# Patient Record
Sex: Female | Born: 1985 | Race: White | Hispanic: No | Marital: Single | Smoking: Never smoker
Health system: Southern US, Community
[De-identification: ages and names within clinical notes are randomized; demographics above are authoritative.]

## PROBLEM LIST (undated history)

## (undated) DIAGNOSIS — Z8619 Personal history of other infectious and parasitic diseases: Secondary | ICD-10-CM

## (undated) DIAGNOSIS — F419 Anxiety disorder, unspecified: Secondary | ICD-10-CM

## (undated) DIAGNOSIS — F32A Depression, unspecified: Secondary | ICD-10-CM

## (undated) DIAGNOSIS — F329 Major depressive disorder, single episode, unspecified: Secondary | ICD-10-CM

## (undated) DIAGNOSIS — C649 Malignant neoplasm of unspecified kidney, except renal pelvis: Secondary | ICD-10-CM

## (undated) HISTORY — DX: Personal history of other infectious and parasitic diseases: Z86.19

## (undated) HISTORY — DX: Malignant neoplasm of unspecified kidney, except renal pelvis: C64.9

## (undated) DEATH — deceased

---
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
## 2003-01-29 HISTORY — PX: WISDOM TOOTH EXTRACTION: SHX21

## 2008-09-28 DIAGNOSIS — C649 Malignant neoplasm of unspecified kidney, except renal pelvis: Secondary | ICD-10-CM

## 2008-09-28 HISTORY — DX: Malignant neoplasm of unspecified kidney, except renal pelvis: C64.9

## 2008-10-27 ENCOUNTER — Ambulatory Visit (HOSPITAL_BASED_OUTPATIENT_CLINIC_OR_DEPARTMENT_OTHER): Admit: 2008-10-27 | Discharge: 2008-10-27 | Disposition: A | Discharge status: Home / Self Care | Payer: Self-pay

## 2008-11-01 ENCOUNTER — Other Ambulatory Visit
Admit: 2008-11-01 | Discharge: 2008-11-01 | Disposition: A | Discharge status: Home / Self Care | Payer: Medicaid Other | Attending: Medical Oncology | Admitting: Medical Oncology

## 2008-11-01 ENCOUNTER — Other Ambulatory Visit (HOSPITAL_BASED_OUTPATIENT_CLINIC_OR_DEPARTMENT_OTHER): Payer: Self-pay | Admitting: Medical Oncology

## 2008-11-01 DIAGNOSIS — C649 Malignant neoplasm of unspecified kidney, except renal pelvis: Secondary | ICD-10-CM

## 2008-11-03 LAB — PATHOLOGY, SURGICAL

## 2008-11-07 ENCOUNTER — Ambulatory Visit: Payer: Medicaid Other | Attending: Urology | Admitting: Urology

## 2008-11-07 DIAGNOSIS — C649 Malignant neoplasm of unspecified kidney, except renal pelvis: Secondary | ICD-10-CM | POA: Insufficient documentation

## 2008-11-09 ENCOUNTER — Ambulatory Visit (HOSPITAL_BASED_OUTPATIENT_CLINIC_OR_DEPARTMENT_OTHER): Payer: Medicaid Other

## 2008-11-09 ENCOUNTER — Ambulatory Visit: Payer: Self-pay

## 2008-11-09 ENCOUNTER — Encounter (HOSPITAL_BASED_OUTPATIENT_CLINIC_OR_DEPARTMENT_OTHER): Payer: Self-pay | Admitting: Urology

## 2008-11-09 ENCOUNTER — Ambulatory Visit: Payer: Medicaid Other | Attending: Medical Oncology | Admitting: Medical Oncology

## 2008-11-09 DIAGNOSIS — C649 Malignant neoplasm of unspecified kidney, except renal pelvis: Secondary | ICD-10-CM | POA: Insufficient documentation

## 2008-11-11 ENCOUNTER — Ambulatory Visit: Payer: Medicaid Other | Attending: Medical Oncology

## 2008-11-11 ENCOUNTER — Other Ambulatory Visit: Payer: Self-pay | Admitting: Medical Oncology

## 2008-11-11 ENCOUNTER — Ambulatory Visit (HOSPITAL_BASED_OUTPATIENT_CLINIC_OR_DEPARTMENT_OTHER): Payer: Medicaid Other

## 2008-11-11 DIAGNOSIS — C649 Malignant neoplasm of unspecified kidney, except renal pelvis: Secondary | ICD-10-CM

## 2008-11-11 LAB — PR MRI BRAIN BRAIN STEM W/O W/CONTRAST MATERIAL

## 2008-11-14 LAB — NM BONE SPECT

## 2008-11-16 ENCOUNTER — Encounter (HOSPITAL_BASED_OUTPATIENT_CLINIC_OR_DEPARTMENT_OTHER): Payer: Self-pay

## 2008-11-16 ENCOUNTER — Ambulatory Visit: Payer: Medicaid Other | Attending: Medical Oncology | Admitting: Medical Oncology

## 2008-11-16 ENCOUNTER — Ambulatory Visit (HOSPITAL_BASED_OUTPATIENT_CLINIC_OR_DEPARTMENT_OTHER): Payer: Medicaid Other

## 2008-11-16 DIAGNOSIS — Z5112 Encounter for antineoplastic immunotherapy: Secondary | ICD-10-CM | POA: Insufficient documentation

## 2008-11-16 DIAGNOSIS — C649 Malignant neoplasm of unspecified kidney, except renal pelvis: Secondary | ICD-10-CM | POA: Insufficient documentation

## 2008-11-21 ENCOUNTER — Ambulatory Visit (HOSPITAL_BASED_OUTPATIENT_CLINIC_OR_DEPARTMENT_OTHER): Payer: Self-pay

## 2008-11-28 ENCOUNTER — Ambulatory Visit: Payer: Medicaid Other | Attending: Medical Oncology

## 2008-11-28 ENCOUNTER — Encounter (HOSPITAL_BASED_OUTPATIENT_CLINIC_OR_DEPARTMENT_OTHER): Payer: Self-pay

## 2008-11-28 DIAGNOSIS — R1084 Generalized abdominal pain: Secondary | ICD-10-CM | POA: Insufficient documentation

## 2008-11-28 DIAGNOSIS — C649 Malignant neoplasm of unspecified kidney, except renal pelvis: Secondary | ICD-10-CM | POA: Insufficient documentation

## 2008-11-28 DIAGNOSIS — R5381 Other malaise: Secondary | ICD-10-CM | POA: Insufficient documentation

## 2008-11-30 ENCOUNTER — Encounter (HOSPITAL_BASED_OUTPATIENT_CLINIC_OR_DEPARTMENT_OTHER): Payer: Self-pay

## 2008-11-30 ENCOUNTER — Ambulatory Visit: Payer: Medicaid Other | Attending: Medical Oncology | Admitting: Medical Oncology

## 2008-11-30 ENCOUNTER — Ambulatory Visit (HOSPITAL_BASED_OUTPATIENT_CLINIC_OR_DEPARTMENT_OTHER): Payer: Medicaid Other

## 2008-11-30 DIAGNOSIS — C649 Malignant neoplasm of unspecified kidney, except renal pelvis: Secondary | ICD-10-CM | POA: Insufficient documentation

## 2008-11-30 DIAGNOSIS — Z5112 Encounter for antineoplastic immunotherapy: Secondary | ICD-10-CM | POA: Insufficient documentation

## 2008-12-02 ENCOUNTER — Ambulatory Visit: Payer: Medicaid Other | Attending: Medical Oncology

## 2008-12-02 DIAGNOSIS — C649 Malignant neoplasm of unspecified kidney, except renal pelvis: Secondary | ICD-10-CM | POA: Insufficient documentation

## 2008-12-05 ENCOUNTER — Ambulatory Visit: Payer: Medicaid Other | Attending: Medical Oncology

## 2008-12-05 DIAGNOSIS — Z5112 Encounter for antineoplastic immunotherapy: Secondary | ICD-10-CM | POA: Insufficient documentation

## 2008-12-05 DIAGNOSIS — C649 Malignant neoplasm of unspecified kidney, except renal pelvis: Secondary | ICD-10-CM | POA: Insufficient documentation

## 2008-12-07 ENCOUNTER — Ambulatory Visit: Payer: Medicaid Other | Attending: Medical Oncology

## 2008-12-07 DIAGNOSIS — C649 Malignant neoplasm of unspecified kidney, except renal pelvis: Secondary | ICD-10-CM | POA: Insufficient documentation

## 2008-12-07 DIAGNOSIS — Z5111 Encounter for antineoplastic chemotherapy: Secondary | ICD-10-CM | POA: Insufficient documentation

## 2008-12-07 DIAGNOSIS — R5381 Other malaise: Secondary | ICD-10-CM | POA: Insufficient documentation

## 2008-12-09 ENCOUNTER — Ambulatory Visit: Payer: Medicaid Other | Attending: Medical Oncology

## 2008-12-09 DIAGNOSIS — C649 Malignant neoplasm of unspecified kidney, except renal pelvis: Secondary | ICD-10-CM | POA: Insufficient documentation

## 2008-12-09 DIAGNOSIS — Z5111 Encounter for antineoplastic chemotherapy: Secondary | ICD-10-CM | POA: Insufficient documentation

## 2008-12-09 DIAGNOSIS — R5381 Other malaise: Secondary | ICD-10-CM | POA: Insufficient documentation

## 2008-12-12 ENCOUNTER — Ambulatory Visit: Payer: Medicaid Other | Attending: Medical Oncology

## 2008-12-12 DIAGNOSIS — Z5111 Encounter for antineoplastic chemotherapy: Secondary | ICD-10-CM | POA: Insufficient documentation

## 2008-12-12 DIAGNOSIS — C649 Malignant neoplasm of unspecified kidney, except renal pelvis: Secondary | ICD-10-CM | POA: Insufficient documentation

## 2008-12-14 ENCOUNTER — Ambulatory Visit (HOSPITAL_BASED_OUTPATIENT_CLINIC_OR_DEPARTMENT_OTHER): Payer: Medicaid Other

## 2008-12-14 ENCOUNTER — Ambulatory Visit: Payer: Medicaid Other | Attending: Medical Oncology | Admitting: Medical Oncology

## 2008-12-14 DIAGNOSIS — C439 Malignant melanoma of skin, unspecified: Secondary | ICD-10-CM | POA: Insufficient documentation

## 2008-12-14 DIAGNOSIS — C649 Malignant neoplasm of unspecified kidney, except renal pelvis: Secondary | ICD-10-CM | POA: Insufficient documentation

## 2008-12-14 DIAGNOSIS — Z5111 Encounter for antineoplastic chemotherapy: Secondary | ICD-10-CM | POA: Insufficient documentation

## 2008-12-16 ENCOUNTER — Ambulatory Visit: Payer: Medicaid Other | Attending: Medical Oncology

## 2008-12-16 DIAGNOSIS — C649 Malignant neoplasm of unspecified kidney, except renal pelvis: Secondary | ICD-10-CM | POA: Insufficient documentation

## 2008-12-16 DIAGNOSIS — Z5111 Encounter for antineoplastic chemotherapy: Secondary | ICD-10-CM | POA: Insufficient documentation

## 2008-12-19 ENCOUNTER — Ambulatory Visit: Payer: Medicaid Other | Attending: Medical Oncology

## 2008-12-19 DIAGNOSIS — C649 Malignant neoplasm of unspecified kidney, except renal pelvis: Secondary | ICD-10-CM | POA: Insufficient documentation

## 2008-12-19 DIAGNOSIS — Z5111 Encounter for antineoplastic chemotherapy: Secondary | ICD-10-CM | POA: Insufficient documentation

## 2008-12-21 ENCOUNTER — Ambulatory Visit: Payer: Medicaid Other | Attending: Medical Oncology

## 2008-12-21 DIAGNOSIS — Z5111 Encounter for antineoplastic chemotherapy: Secondary | ICD-10-CM | POA: Insufficient documentation

## 2008-12-21 DIAGNOSIS — C649 Malignant neoplasm of unspecified kidney, except renal pelvis: Secondary | ICD-10-CM | POA: Insufficient documentation

## 2008-12-23 ENCOUNTER — Ambulatory Visit: Payer: Medicaid Other | Attending: Medical Oncology

## 2008-12-23 DIAGNOSIS — C649 Malignant neoplasm of unspecified kidney, except renal pelvis: Secondary | ICD-10-CM | POA: Insufficient documentation

## 2008-12-23 DIAGNOSIS — Z5112 Encounter for antineoplastic immunotherapy: Secondary | ICD-10-CM | POA: Insufficient documentation

## 2008-12-26 ENCOUNTER — Ambulatory Visit: Payer: Medicaid Other | Attending: Medical Oncology

## 2008-12-26 DIAGNOSIS — Z5112 Encounter for antineoplastic immunotherapy: Secondary | ICD-10-CM | POA: Insufficient documentation

## 2008-12-26 DIAGNOSIS — C649 Malignant neoplasm of unspecified kidney, except renal pelvis: Secondary | ICD-10-CM | POA: Insufficient documentation

## 2008-12-28 ENCOUNTER — Ambulatory Visit (HOSPITAL_BASED_OUTPATIENT_CLINIC_OR_DEPARTMENT_OTHER): Payer: Medicaid Other

## 2008-12-28 ENCOUNTER — Encounter (HOSPITAL_BASED_OUTPATIENT_CLINIC_OR_DEPARTMENT_OTHER): Payer: Self-pay

## 2008-12-28 ENCOUNTER — Ambulatory Visit: Payer: Medicaid Other | Attending: Medical Oncology | Admitting: Medical Oncology

## 2008-12-28 DIAGNOSIS — Z5112 Encounter for antineoplastic immunotherapy: Secondary | ICD-10-CM | POA: Insufficient documentation

## 2008-12-28 DIAGNOSIS — C649 Malignant neoplasm of unspecified kidney, except renal pelvis: Secondary | ICD-10-CM | POA: Insufficient documentation

## 2008-12-30 ENCOUNTER — Ambulatory Visit: Payer: Medicaid Other | Attending: Medical Oncology

## 2008-12-30 DIAGNOSIS — Z5112 Encounter for antineoplastic immunotherapy: Secondary | ICD-10-CM | POA: Insufficient documentation

## 2008-12-30 DIAGNOSIS — C649 Malignant neoplasm of unspecified kidney, except renal pelvis: Secondary | ICD-10-CM | POA: Insufficient documentation

## 2009-01-02 ENCOUNTER — Ambulatory Visit: Payer: Medicaid Other | Attending: Medical Oncology

## 2009-01-02 DIAGNOSIS — Z5112 Encounter for antineoplastic immunotherapy: Secondary | ICD-10-CM | POA: Insufficient documentation

## 2009-01-02 DIAGNOSIS — C649 Malignant neoplasm of unspecified kidney, except renal pelvis: Secondary | ICD-10-CM | POA: Insufficient documentation

## 2009-01-04 ENCOUNTER — Ambulatory Visit: Payer: Medicaid Other | Attending: Medical Oncology

## 2009-01-04 DIAGNOSIS — Z5112 Encounter for antineoplastic immunotherapy: Secondary | ICD-10-CM | POA: Insufficient documentation

## 2009-01-04 DIAGNOSIS — C649 Malignant neoplasm of unspecified kidney, except renal pelvis: Secondary | ICD-10-CM | POA: Insufficient documentation

## 2009-01-06 ENCOUNTER — Ambulatory Visit: Payer: Medicaid Other | Attending: Medical Oncology

## 2009-01-06 DIAGNOSIS — Z5112 Encounter for antineoplastic immunotherapy: Secondary | ICD-10-CM | POA: Insufficient documentation

## 2009-01-06 DIAGNOSIS — C649 Malignant neoplasm of unspecified kidney, except renal pelvis: Secondary | ICD-10-CM | POA: Insufficient documentation

## 2009-01-09 ENCOUNTER — Encounter (HOSPITAL_BASED_OUTPATIENT_CLINIC_OR_DEPARTMENT_OTHER): Payer: Medicaid Other

## 2009-01-10 ENCOUNTER — Ambulatory Visit (HOSPITAL_BASED_OUTPATIENT_CLINIC_OR_DEPARTMENT_OTHER): Payer: Medicaid Other

## 2009-01-10 ENCOUNTER — Other Ambulatory Visit: Payer: Self-pay | Admitting: Medical Oncology

## 2009-01-10 ENCOUNTER — Ambulatory Visit: Payer: Medicaid Other | Attending: Medical Oncology

## 2009-01-10 DIAGNOSIS — C649 Malignant neoplasm of unspecified kidney, except renal pelvis: Secondary | ICD-10-CM

## 2009-01-10 DIAGNOSIS — R599 Enlarged lymph nodes, unspecified: Secondary | ICD-10-CM | POA: Insufficient documentation

## 2009-01-10 LAB — PR CT SCAN OF PELVIS CONTRAST

## 2009-01-10 LAB — PR DIAGNOSTIC COMPUTED TOMOGRAPHY THORAX W/CONTRAST

## 2009-01-10 LAB — PR CT SCAN OF ABDOMEN CONTRAST

## 2009-01-11 ENCOUNTER — Ambulatory Visit: Payer: Medicaid Other | Attending: Medical Oncology | Admitting: Medical Oncology

## 2009-01-11 ENCOUNTER — Ambulatory Visit (HOSPITAL_BASED_OUTPATIENT_CLINIC_OR_DEPARTMENT_OTHER): Payer: Medicaid Other

## 2009-01-11 DIAGNOSIS — R110 Nausea: Secondary | ICD-10-CM | POA: Insufficient documentation

## 2009-01-11 DIAGNOSIS — C649 Malignant neoplasm of unspecified kidney, except renal pelvis: Secondary | ICD-10-CM | POA: Insufficient documentation

## 2009-01-11 DIAGNOSIS — Z5112 Encounter for antineoplastic immunotherapy: Secondary | ICD-10-CM | POA: Insufficient documentation

## 2009-01-13 ENCOUNTER — Encounter (HOSPITAL_BASED_OUTPATIENT_CLINIC_OR_DEPARTMENT_OTHER): Payer: Self-pay

## 2009-01-14 ENCOUNTER — Ambulatory Visit: Payer: Medicaid Other | Attending: Medical Oncology

## 2009-01-14 DIAGNOSIS — C649 Malignant neoplasm of unspecified kidney, except renal pelvis: Secondary | ICD-10-CM | POA: Insufficient documentation

## 2009-01-25 ENCOUNTER — Ambulatory Visit (HOSPITAL_BASED_OUTPATIENT_CLINIC_OR_DEPARTMENT_OTHER): Payer: Medicaid Other

## 2009-01-25 ENCOUNTER — Ambulatory Visit: Payer: Medicaid Other | Attending: Medical Oncology

## 2009-01-25 DIAGNOSIS — Z5112 Encounter for antineoplastic immunotherapy: Secondary | ICD-10-CM | POA: Insufficient documentation

## 2009-01-25 DIAGNOSIS — C649 Malignant neoplasm of unspecified kidney, except renal pelvis: Secondary | ICD-10-CM | POA: Insufficient documentation

## 2009-01-28 HISTORY — PX: NEPHRECTOMY: SHX65

## 2009-02-08 ENCOUNTER — Ambulatory Visit (HOSPITAL_BASED_OUTPATIENT_CLINIC_OR_DEPARTMENT_OTHER): Payer: Medicaid Other

## 2009-02-08 ENCOUNTER — Ambulatory Visit: Payer: Medicaid Other | Attending: Medical Oncology | Admitting: Medical Oncology

## 2009-02-08 DIAGNOSIS — Z5112 Encounter for antineoplastic immunotherapy: Secondary | ICD-10-CM | POA: Insufficient documentation

## 2009-02-08 DIAGNOSIS — C649 Malignant neoplasm of unspecified kidney, except renal pelvis: Secondary | ICD-10-CM

## 2009-02-22 ENCOUNTER — Ambulatory Visit: Payer: Medicaid Other | Attending: Medical Oncology

## 2009-02-22 ENCOUNTER — Ambulatory Visit (HOSPITAL_BASED_OUTPATIENT_CLINIC_OR_DEPARTMENT_OTHER): Payer: Medicaid Other

## 2009-02-22 DIAGNOSIS — C649 Malignant neoplasm of unspecified kidney, except renal pelvis: Secondary | ICD-10-CM | POA: Insufficient documentation

## 2009-02-22 DIAGNOSIS — Z5112 Encounter for antineoplastic immunotherapy: Secondary | ICD-10-CM | POA: Insufficient documentation

## 2009-03-07 ENCOUNTER — Ambulatory Visit (HOSPITAL_BASED_OUTPATIENT_CLINIC_OR_DEPARTMENT_OTHER): Payer: Medicaid Other

## 2009-03-07 ENCOUNTER — Other Ambulatory Visit: Payer: Self-pay | Admitting: Medical Oncology

## 2009-03-07 ENCOUNTER — Ambulatory Visit: Payer: Medicaid Other | Attending: Medical Oncology

## 2009-03-07 DIAGNOSIS — R599 Enlarged lymph nodes, unspecified: Secondary | ICD-10-CM | POA: Insufficient documentation

## 2009-03-07 DIAGNOSIS — C649 Malignant neoplasm of unspecified kidney, except renal pelvis: Secondary | ICD-10-CM

## 2009-03-07 LAB — PR DIAGNOSTIC COMPUTED TOMOGRAPHY THORAX W/CONTRAST

## 2009-03-07 LAB — PR CT ABDOMEN AND PELVIS W CONT

## 2009-03-08 ENCOUNTER — Ambulatory Visit: Payer: Medicaid Other | Attending: Medical Oncology | Admitting: Medical Oncology

## 2009-03-08 ENCOUNTER — Encounter (HOSPITAL_BASED_OUTPATIENT_CLINIC_OR_DEPARTMENT_OTHER): Payer: Medicaid Other

## 2009-03-08 DIAGNOSIS — C649 Malignant neoplasm of unspecified kidney, except renal pelvis: Secondary | ICD-10-CM | POA: Insufficient documentation

## 2009-03-13 ENCOUNTER — Ambulatory Visit: Payer: Medicaid Other | Attending: Urology | Admitting: Urology

## 2009-03-13 DIAGNOSIS — C649 Malignant neoplasm of unspecified kidney, except renal pelvis: Secondary | ICD-10-CM | POA: Insufficient documentation

## 2009-03-24 ENCOUNTER — Ambulatory Visit: Payer: Medicaid Other

## 2009-03-29 ENCOUNTER — Inpatient Hospital Stay
Admission: RE | Admit: 2009-03-29 | Discharge: 2009-04-04 | DRG: 567 | Disposition: A | Discharge status: Home / Self Care | Payer: Medicaid Other | Attending: Urology | Admitting: Urology

## 2009-03-29 ENCOUNTER — Other Ambulatory Visit (HOSPITAL_BASED_OUTPATIENT_CLINIC_OR_DEPARTMENT_OTHER): Payer: Self-pay | Admitting: Urology

## 2009-03-29 ENCOUNTER — Inpatient Hospital Stay (HOSPITAL_COMMUNITY): Payer: Medicaid Other | Admitting: Urology

## 2009-03-29 DIAGNOSIS — C649 Malignant neoplasm of unspecified kidney, except renal pelvis: Principal | ICD-10-CM | POA: Diagnosis present

## 2009-03-29 DIAGNOSIS — N289 Disorder of kidney and ureter, unspecified: Secondary | ICD-10-CM

## 2009-03-29 DIAGNOSIS — Z9221 Personal history of antineoplastic chemotherapy: Secondary | ICD-10-CM

## 2009-03-29 DIAGNOSIS — C772 Secondary and unspecified malignant neoplasm of intra-abdominal lymph nodes: Secondary | ICD-10-CM

## 2009-03-29 DIAGNOSIS — R000 Tachycardia, unspecified: Secondary | ICD-10-CM | POA: Diagnosis not present

## 2009-03-29 DIAGNOSIS — R599 Enlarged lymph nodes, unspecified: Secondary | ICD-10-CM | POA: Diagnosis present

## 2009-04-02 DIAGNOSIS — J9819 Other pulmonary collapse: Secondary | ICD-10-CM

## 2009-04-03 DIAGNOSIS — R109 Unspecified abdominal pain: Secondary | ICD-10-CM

## 2009-04-03 DIAGNOSIS — G8918 Other acute postprocedural pain: Secondary | ICD-10-CM

## 2009-04-03 DIAGNOSIS — R079 Chest pain, unspecified: Secondary | ICD-10-CM

## 2009-04-04 DIAGNOSIS — R071 Chest pain on breathing: Secondary | ICD-10-CM

## 2009-04-12 ENCOUNTER — Other Ambulatory Visit: Payer: Self-pay | Admitting: Urology

## 2009-04-12 ENCOUNTER — Ambulatory Visit: Payer: Medicaid Other | Attending: Urology

## 2009-04-12 DIAGNOSIS — G8918 Other acute postprocedural pain: Secondary | ICD-10-CM

## 2009-04-12 DIAGNOSIS — C649 Malignant neoplasm of unspecified kidney, except renal pelvis: Secondary | ICD-10-CM | POA: Insufficient documentation

## 2009-04-13 LAB — X-RAY CHEST 2 VW

## 2009-05-03 ENCOUNTER — Ambulatory Visit: Payer: Medicaid Other | Attending: Medical Oncology

## 2009-05-03 DIAGNOSIS — C649 Malignant neoplasm of unspecified kidney, except renal pelvis: Secondary | ICD-10-CM | POA: Insufficient documentation

## 2009-05-15 ENCOUNTER — Ambulatory Visit: Payer: Medicaid Other | Attending: Urology | Admitting: Urology

## 2009-05-15 DIAGNOSIS — C649 Malignant neoplasm of unspecified kidney, except renal pelvis: Secondary | ICD-10-CM | POA: Insufficient documentation

## 2009-05-30 ENCOUNTER — Ambulatory Visit (HOSPITAL_BASED_OUTPATIENT_CLINIC_OR_DEPARTMENT_OTHER): Payer: MEDICAID

## 2009-05-30 ENCOUNTER — Other Ambulatory Visit: Payer: Self-pay | Admitting: Medical Oncology

## 2009-05-30 ENCOUNTER — Ambulatory Visit: Payer: MEDICAID | Attending: Medical Oncology

## 2009-05-30 DIAGNOSIS — C649 Malignant neoplasm of unspecified kidney, except renal pelvis: Secondary | ICD-10-CM | POA: Insufficient documentation

## 2009-05-31 ENCOUNTER — Ambulatory Visit (HOSPITAL_BASED_OUTPATIENT_CLINIC_OR_DEPARTMENT_OTHER): Payer: MEDICAID

## 2009-05-31 ENCOUNTER — Ambulatory Visit: Payer: MEDICAID | Attending: Medical Oncology | Admitting: Medical Oncology

## 2009-05-31 DIAGNOSIS — C649 Malignant neoplasm of unspecified kidney, except renal pelvis: Secondary | ICD-10-CM | POA: Insufficient documentation

## 2009-06-01 LAB — PR DIAGNOSTIC COMPUTED TOMOGRAPHY THORAX W/CONTRAST

## 2009-06-01 LAB — PR CT ABDOMEN AND PELVIS W CONT

## 2009-06-02 ENCOUNTER — Ambulatory Visit (HOSPITAL_BASED_OUTPATIENT_CLINIC_OR_DEPARTMENT_OTHER): Payer: Medicaid Other | Admitting: Physical Therapist

## 2009-06-09 ENCOUNTER — Ambulatory Visit: Payer: MEDICAID | Attending: Medical Oncology

## 2009-06-09 DIAGNOSIS — M6281 Muscle weakness (generalized): Secondary | ICD-10-CM | POA: Insufficient documentation

## 2009-06-09 DIAGNOSIS — L905 Scar conditions and fibrosis of skin: Secondary | ICD-10-CM | POA: Insufficient documentation

## 2009-06-09 DIAGNOSIS — M546 Pain in thoracic spine: Secondary | ICD-10-CM | POA: Insufficient documentation

## 2009-06-09 DIAGNOSIS — R293 Abnormal posture: Secondary | ICD-10-CM | POA: Insufficient documentation

## 2009-06-09 DIAGNOSIS — R609 Edema, unspecified: Secondary | ICD-10-CM | POA: Insufficient documentation

## 2009-06-27 ENCOUNTER — Ambulatory Visit (HOSPITAL_BASED_OUTPATIENT_CLINIC_OR_DEPARTMENT_OTHER): Payer: MEDICAID

## 2009-06-27 ENCOUNTER — Ambulatory Visit (HOSPITAL_BASED_OUTPATIENT_CLINIC_OR_DEPARTMENT_OTHER): Payer: Medicaid Other

## 2009-06-28 ENCOUNTER — Ambulatory Visit (HOSPITAL_BASED_OUTPATIENT_CLINIC_OR_DEPARTMENT_OTHER): Payer: Self-pay

## 2009-07-04 ENCOUNTER — Ambulatory Visit: Payer: MEDICAID | Attending: Medical Oncology

## 2009-07-04 DIAGNOSIS — L905 Scar conditions and fibrosis of skin: Secondary | ICD-10-CM | POA: Insufficient documentation

## 2009-07-04 DIAGNOSIS — R293 Abnormal posture: Secondary | ICD-10-CM | POA: Insufficient documentation

## 2009-07-04 DIAGNOSIS — M546 Pain in thoracic spine: Secondary | ICD-10-CM | POA: Insufficient documentation

## 2009-07-04 DIAGNOSIS — M6281 Muscle weakness (generalized): Secondary | ICD-10-CM | POA: Insufficient documentation

## 2009-07-04 DIAGNOSIS — R609 Edema, unspecified: Secondary | ICD-10-CM | POA: Insufficient documentation

## 2009-07-12 ENCOUNTER — Ambulatory Visit: Payer: MEDICAID | Attending: Medical Oncology

## 2009-07-12 ENCOUNTER — Other Ambulatory Visit: Payer: Self-pay | Admitting: Medical Oncology

## 2009-07-12 ENCOUNTER — Ambulatory Visit (HOSPITAL_BASED_OUTPATIENT_CLINIC_OR_DEPARTMENT_OTHER): Payer: MEDICAID

## 2009-07-12 DIAGNOSIS — N83209 Unspecified ovarian cyst, unspecified side: Secondary | ICD-10-CM | POA: Insufficient documentation

## 2009-07-12 DIAGNOSIS — C649 Malignant neoplasm of unspecified kidney, except renal pelvis: Secondary | ICD-10-CM

## 2009-07-12 LAB — PR US EXAM, PELVIC, COMPLETE

## 2009-07-19 ENCOUNTER — Ambulatory Visit (HOSPITAL_BASED_OUTPATIENT_CLINIC_OR_DEPARTMENT_OTHER): Payer: MEDICAID

## 2009-07-19 ENCOUNTER — Ambulatory Visit: Payer: MEDICAID | Attending: Medical Oncology

## 2009-07-19 DIAGNOSIS — C649 Malignant neoplasm of unspecified kidney, except renal pelvis: Secondary | ICD-10-CM | POA: Insufficient documentation

## 2009-07-19 DIAGNOSIS — Z713 Dietary counseling and surveillance: Secondary | ICD-10-CM | POA: Insufficient documentation

## 2009-10-03 ENCOUNTER — Ambulatory Visit: Payer: MEDICAID | Attending: Medical Oncology

## 2009-10-03 ENCOUNTER — Ambulatory Visit (HOSPITAL_BASED_OUTPATIENT_CLINIC_OR_DEPARTMENT_OTHER): Payer: MEDICAID

## 2009-10-03 ENCOUNTER — Other Ambulatory Visit: Payer: Self-pay | Admitting: Medical Oncology

## 2009-10-03 DIAGNOSIS — C649 Malignant neoplasm of unspecified kidney, except renal pelvis: Secondary | ICD-10-CM | POA: Insufficient documentation

## 2009-10-04 ENCOUNTER — Ambulatory Visit: Payer: MEDICAID | Attending: Medical Oncology | Admitting: Medical Oncology

## 2009-10-04 ENCOUNTER — Ambulatory Visit (HOSPITAL_BASED_OUTPATIENT_CLINIC_OR_DEPARTMENT_OTHER): Payer: MEDICAID

## 2009-10-04 DIAGNOSIS — Z8553 Personal history of malignant neoplasm of renal pelvis: Secondary | ICD-10-CM | POA: Insufficient documentation

## 2009-10-04 DIAGNOSIS — N83209 Unspecified ovarian cyst, unspecified side: Secondary | ICD-10-CM | POA: Insufficient documentation

## 2009-10-04 DIAGNOSIS — C649 Malignant neoplasm of unspecified kidney, except renal pelvis: Secondary | ICD-10-CM

## 2009-10-04 LAB — PR CT CHEST ABDOMEN AND PELVIS W CONTRAST

## 2010-03-26 ENCOUNTER — Ambulatory Visit: Payer: PRIVATE HEALTH INSURANCE | Admitting: Medical Oncology

## 2010-04-04 ENCOUNTER — Other Ambulatory Visit (HOSPITAL_BASED_OUTPATIENT_CLINIC_OR_DEPARTMENT_OTHER): Payer: Self-pay

## 2010-04-04 ENCOUNTER — Ambulatory Visit (HOSPITAL_BASED_OUTPATIENT_CLINIC_OR_DEPARTMENT_OTHER): Payer: PRIVATE HEALTH INSURANCE

## 2010-04-04 ENCOUNTER — Other Ambulatory Visit: Payer: Self-pay | Admitting: Medical Oncology

## 2010-04-04 ENCOUNTER — Ambulatory Visit: Payer: PRIVATE HEALTH INSURANCE | Attending: Medical Oncology | Admitting: Medical Oncology

## 2010-04-04 DIAGNOSIS — C649 Malignant neoplasm of unspecified kidney, except renal pelvis: Secondary | ICD-10-CM

## 2010-04-04 DIAGNOSIS — Z905 Acquired absence of kidney: Secondary | ICD-10-CM | POA: Insufficient documentation

## 2010-04-04 LAB — CBC, DIFF
% Basophils: 0 % (ref 0–1)
% Eosinophils: 1 % (ref 0–7)
% Immature Granulocytes: 0 % (ref 0–1)
% Lymphocytes: 29 % (ref 19–53)
% Monocytes: 5 % (ref 5–13)
% Neutrophils: 65 % (ref 34–71)
Absolute Eosinophil Count: 0.08 10*3/uL (ref 0.00–0.50)
Absolute Lymphocyte Count: 1.61 10*3/uL (ref 1.00–4.80)
Basophils: 0.02 10*3/uL (ref 0.00–0.20)
Hematocrit: 38 % (ref 36–45)
Hemoglobin: 12.8 g/dL (ref 11.5–15.5)
Immature Granulocytes: 0.01 10*3/uL (ref 0.00–0.05)
MCH: 30.9 pg (ref 27.3–33.6)
MCHC: 33.4 g/dL (ref 32.2–36.5)
MCV: 93 fL (ref 81–98)
Monocytes: 0.28 10*3/uL (ref 0.00–0.80)
Neutrophils: 3.58 10*3/uL (ref 1.80–7.00)
Platelet Count: 179 10*3/uL (ref 150–400)
RBC: 4.14 mil/uL (ref 3.80–5.00)
RDW-CV: 12.7 % (ref 11.6–14.4)
WBC: 5.58 10*3/uL (ref 4.3–10.0)

## 2010-04-04 LAB — URINALYSIS COMPLETE, URN
Bacteria, URN: NONE SEEN
Bilirubin (Qual), URN: NEGATIVE
Epith Cells_Renal/Trans,URN: NEGATIVE /HPF
Glucose Qual, URN: NEGATIVE mg/dL
Ketones, URN: NEGATIVE mg/dL
Leukocyte Esterase, URN: POSITIVE — AB
Nitrite, URN: NEGATIVE
Occult Blood, URN: NEGATIVE
Protein (Alb Semiquant), URN: NEGATIVE mg/dL
RBC, URN: NEGATIVE /HPF
Specific Gravity, URN: 1.02 g/mL (ref 1.005–1.030)

## 2010-04-04 LAB — BASIC METABOLIC PANEL
Anion Gap: 4 (ref 3–11)
Calcium: 8.5 mg/dL — ABNORMAL LOW (ref 8.9–10.2)
Carbon Dioxide, Total: 25 mEq/L (ref 22–32)
Chloride: 108 mEq/L (ref 98–108)
Creatinine: 0.87 mg/dL (ref 0.38–1.02)
GFR, Calc, African American: >60 mL/min (ref >59)
GFR, Calc, European American: >60 mL/min (ref >59)
Glucose: 90 mg/dL (ref 62–125)
Potassium: 4.2 mEq/L (ref 3.7–5.2)
Sodium: 137 mEq/L (ref 136–145)
Urea Nitrogen: 22 mg/dL — ABNORMAL HIGH (ref 8–21)

## 2010-04-04 LAB — HEPATIC FUNCTION PANEL W/ LD
ALT (GPT): 9 U/L (ref 6–40)
AST (GOT): 14 U/L — ABNORMAL LOW (ref 15–40)
Albumin: 3.5 g/dL (ref 3.5–5.2)
Alkaline Phosphatase (Total): 60 U/L (ref 25–100)
Bilirubin (Direct): 0.1 mg/dL (ref 0.0–0.3)
Bilirubin (Total): 0.6 mg/dL (ref 0.2–1.3)
Lactate Dehydrogenase: 170 U/L (ref 80–190)
Protein (Total): 7 g/dL (ref 6.0–8.2)

## 2010-04-04 LAB — PR CT CHEST ABDOMEN AND PELVIS W CONTRAST

## 2010-04-04 LAB — URINE COLLECTION, RAND _ FHCC

## 2010-04-05 ENCOUNTER — Other Ambulatory Visit (HOSPITAL_BASED_OUTPATIENT_CLINIC_OR_DEPARTMENT_OTHER): Payer: Self-pay

## 2010-10-10 ENCOUNTER — Other Ambulatory Visit: Payer: Self-pay | Admitting: Medical Oncology

## 2010-10-10 ENCOUNTER — Other Ambulatory Visit (HOSPITAL_BASED_OUTPATIENT_CLINIC_OR_DEPARTMENT_OTHER): Payer: Self-pay

## 2010-10-10 ENCOUNTER — Ambulatory Visit (HOSPITAL_BASED_OUTPATIENT_CLINIC_OR_DEPARTMENT_OTHER): Payer: PRIVATE HEALTH INSURANCE

## 2010-10-10 ENCOUNTER — Ambulatory Visit: Payer: PRIVATE HEALTH INSURANCE | Attending: Medical Oncology | Admitting: Medical Oncology

## 2010-10-10 DIAGNOSIS — C649 Malignant neoplasm of unspecified kidney, except renal pelvis: Secondary | ICD-10-CM

## 2010-10-10 DIAGNOSIS — M549 Dorsalgia, unspecified: Secondary | ICD-10-CM | POA: Insufficient documentation

## 2010-10-10 DIAGNOSIS — Z905 Acquired absence of kidney: Secondary | ICD-10-CM | POA: Insufficient documentation

## 2010-10-10 DIAGNOSIS — N329 Bladder disorder, unspecified: Secondary | ICD-10-CM | POA: Insufficient documentation

## 2010-10-10 DIAGNOSIS — Z9221 Personal history of antineoplastic chemotherapy: Secondary | ICD-10-CM | POA: Insufficient documentation

## 2010-10-10 LAB — HEPATIC FUNCTION PANEL W/ LD
ALT (GPT): 10 U/L (ref 6–40)
AST (GOT): 15 U/L (ref 15–40)
Albumin: 3.7 g/dL (ref 3.5–5.2)
Alkaline Phosphatase (Total): 77 U/L (ref 25–100)
Bilirubin (Direct): 0.1 mg/dL (ref 0.0–0.3)
Bilirubin (Total): 0.5 mg/dL (ref 0.2–1.3)
Lactate Dehydrogenase: 184 U/L (ref 80–190)
Protein (Total): 7.3 g/dL (ref 6.0–8.2)

## 2010-10-10 LAB — CBC, DIFF
% Basophils: 1 % (ref 0–1)
% Eosinophils: 2 % (ref 0–7)
% Immature Granulocytes: 0 % (ref 0–1)
% Lymphocytes: 23 % (ref 19–53)
% Monocytes: 5 % (ref 5–13)
% Neutrophils: 69 % (ref 34–71)
Absolute Eosinophil Count: 0.11 10*3/uL (ref 0.00–0.50)
Absolute Lymphocyte Count: 1.5 10*3/uL (ref 1.00–4.80)
Basophils: 0.04 10*3/uL (ref 0.00–0.20)
Hematocrit: 37 % (ref 36–45)
Hemoglobin: 12.8 g/dL (ref 11.5–15.5)
Immature Granulocytes: 0.01 10*3/uL (ref 0.00–0.05)
MCH: 31.4 pg (ref 27.3–33.6)
MCHC: 34.2 g/dL (ref 32.2–36.5)
MCV: 92 fL (ref 81–98)
Monocytes: 0.29 10*3/uL (ref 0.00–0.80)
Neutrophils: 4.54 10*3/uL (ref 1.80–7.00)
Platelet Count: 221 10*3/uL (ref 150–400)
RBC: 4.07 mil/uL (ref 3.80–5.00)
RDW-CV: 12.9 % (ref 11.6–14.4)
WBC: 6.49 10*3/uL (ref 4.3–10.0)

## 2010-10-10 LAB — BASIC METABOLIC PANEL
Anion Gap: 7 (ref 3–11)
Calcium: 8.8 mg/dL — ABNORMAL LOW (ref 8.9–10.2)
Carbon Dioxide, Total: 27 mEq/L (ref 22–32)
Chloride: 103 mEq/L (ref 98–108)
Creatinine: 1.02 mg/dL (ref 0.38–1.02)
GFR, Calc, African American: >60 mL/min (ref >59)
GFR, Calc, European American: >60 mL/min (ref >59)
Glucose: 87 mg/dL (ref 62–125)
Potassium: 4.2 mEq/L (ref 3.7–5.2)
Sodium: 137 mEq/L (ref 136–145)
Urea Nitrogen: 19 mg/dL (ref 8–21)

## 2010-10-10 LAB — PR CT CHEST ABDOMEN AND PELVIS W CONTRAST

## 2013-01-28 HISTORY — PX: KNEE SURGERY: SHX244

## 2013-06-16 HISTORY — PX: CHOLECYSTECTOMY: SHX55

## 2016-02-11 ENCOUNTER — Encounter (HOSPITAL_COMMUNITY): Payer: Self-pay | Admitting: Emergency Medicine

## 2016-02-11 ENCOUNTER — Ambulatory Visit (HOSPITAL_COMMUNITY)
Admission: EM | Admit: 2016-02-11 | Discharge: 2016-02-11 | Disposition: A | Discharge status: Home / Self Care | Payer: BLUE CROSS/BLUE SHIELD | Attending: Emergency Medicine | Admitting: Emergency Medicine

## 2016-02-11 DIAGNOSIS — J40 Bronchitis, not specified as acute or chronic: Secondary | ICD-10-CM | POA: Diagnosis not present

## 2016-02-11 DIAGNOSIS — R05 Cough: Secondary | ICD-10-CM | POA: Diagnosis not present

## 2016-02-11 DIAGNOSIS — R059 Cough, unspecified: Secondary | ICD-10-CM

## 2016-02-11 HISTORY — DX: Anxiety disorder, unspecified: F41.9

## 2016-02-11 HISTORY — DX: Depression, unspecified: F32.A

## 2016-02-11 HISTORY — DX: Major depressive disorder, single episode, unspecified: F32.9

## 2016-02-11 MED ORDER — PREDNISONE 10 MG PO TABS: 10.0000 mg | ORAL_TABLET | Freq: Two times a day (BID) | ORAL | 0 refills | Status: DC

## 2016-02-11 MED ORDER — DEXTROMETHORPHAN HBR 15 MG/5ML PO SYRP: 10.0000 mL | ORAL_SOLUTION | Freq: Four times a day (QID) | ORAL | 0 refills | Status: DC | PRN

## 2016-02-11 NOTE — ED Triage Notes (Signed)
The patient presented to the [facility] with a complaint of a cough for 3 weeks and wheezing for the last 5 days.

## 2016-02-11 NOTE — ED Provider Notes (Signed)
CSN: [id number]     Arrival date & time 02/11/16  1206 History   First MD Initiated Contact with Patient 02/11/16 1253     Chief Complaint  Patient presents with  . Cough   (Consider location/radiation/quality/duration/timing/severity/associated sxs/prior Treatment) Pt c/o cough congestion for approx 2 weeks intermit green phlem and dry cough worse at night. . Denies any post nasal drip, no fevers, no n/v/d. Has taken OTC cough meds with minimal relief.       Past Medical History:  Diagnosis Date  . Anxiety   . Depression    Past Surgical History:  Procedure Laterality Date  . CHOLECYSTECTOMY    . KNEE SURGERY Right   . NEPHRECTOMY Left    History reviewed. No pertinent family history. Social History  Substance Use Topics  . Smoking status: Never Smoker  . Smokeless tobacco: Never Used  . Alcohol use No   OB History    No data available     Review of Systems  Constitutional: Negative.   HENT: Negative.   Eyes: Negative.   Respiratory: Positive for cough.   Cardiovascular: Negative.   Gastrointestinal: Negative.   Genitourinary: Negative.   Musculoskeletal: Negative.   Skin: Negative.   Neurological: Negative.     Allergies  Patient has no known allergies.  Home Medications   Prior to Admission medications   Medication Sig Start Date End Date Taking? Authorizing Provider  cholecalciferol (VITAMIN D) 1000 units tablet Take 1,000 Units by mouth daily.   Yes Historical Provider, MD  sertraline (ZOLOFT) 50 MG tablet Take 50 mg by mouth daily.   Yes Historical Provider, MD  dextromethorphan 15 MG/5ML syrup Take 10 mLs (30 mg total) by mouth 4 (four) times daily as needed for cough. 02/11/16   Melanee Left, NP  predniSONE (DELTASONE) 10 MG tablet Take 1 tablet (10 mg total) by mouth 2 (two) times daily with a meal. 02/11/16   Melanee Left, NP   Meds Ordered and Administered this Visit  Medications - No data to display  BP 141/90 (BP Location: Left  Wrist)   Pulse 79   Temp 98.4 F (36.9 C) (Oral)   Resp 18   LMP 01/28/2016 (Approximate)   SpO2 99%  No data found.   Physical Exam  Constitutional: She appears well-developed.  HENT:  Head: Normocephalic.  Right Ear: External ear normal.  Left Ear: External ear normal.  Mouth/Throat: Oropharynx is clear and moist.  Eyes: Pupils are equal, round, and reactive to light.  Neck: Normal range of motion.  Cardiovascular: Normal rate and regular rhythm.   Pulmonary/Chest:  Non productive cough,   Abdominal: Soft. Bowel sounds are normal.  Musculoskeletal: Normal range of motion.  Neurological: She is alert.  Skin: Skin is warm. Capillary refill takes less than 2 seconds.    Urgent Care Course   Clinical Course     Procedures (including critical care time)  Labs Review Labs Reviewed - No data to display  Imaging Review No results found.          MDM   1. Cough   2. Bronchitis    You have a cough with no fever. Sx of this may become worse at night and the cough may linger for 2 weeks post treatment.  It does not appear that you have an infection. Take the meds as prescribed , push fluids , may use a humidifier at night to help     Melanee Left, NP 02/11/16 1305

## 2016-02-23 ENCOUNTER — Ambulatory Visit (INDEPENDENT_AMBULATORY_CARE_PROVIDER_SITE_OTHER): Payer: BLUE CROSS/BLUE SHIELD | Admitting: Nurse Practitioner

## 2016-02-23 ENCOUNTER — Encounter: Payer: Self-pay | Admitting: Nurse Practitioner

## 2016-02-23 ENCOUNTER — Other Ambulatory Visit (INDEPENDENT_AMBULATORY_CARE_PROVIDER_SITE_OTHER): Payer: BLUE CROSS/BLUE SHIELD

## 2016-02-23 ENCOUNTER — Ambulatory Visit (INDEPENDENT_AMBULATORY_CARE_PROVIDER_SITE_OTHER)
Admission: RE | Admit: 2016-02-23 | Discharge: 2016-02-23 | Disposition: A | Discharge status: Home / Self Care | Payer: BLUE CROSS/BLUE SHIELD | Source: Ambulatory Visit | Attending: Nurse Practitioner | Admitting: Nurse Practitioner

## 2016-02-23 ENCOUNTER — Other Ambulatory Visit: Payer: Self-pay | Admitting: Nurse Practitioner

## 2016-02-23 VITALS — BP 128/70 | HR 75 | Temp 97.5°F | Ht 69.0 in | Wt 345.0 lb

## 2016-02-23 DIAGNOSIS — Z0001 Encounter for general adult medical examination with abnormal findings: Secondary | ICD-10-CM

## 2016-02-23 DIAGNOSIS — G8929 Other chronic pain: Secondary | ICD-10-CM

## 2016-02-23 DIAGNOSIS — M25562 Pain in left knee: Principal | ICD-10-CM

## 2016-02-23 DIAGNOSIS — F4323 Adjustment disorder with mixed anxiety and depressed mood: Secondary | ICD-10-CM

## 2016-02-23 DIAGNOSIS — Z905 Acquired absence of kidney: Secondary | ICD-10-CM

## 2016-02-23 DIAGNOSIS — M25561 Pain in right knee: Secondary | ICD-10-CM

## 2016-02-23 DIAGNOSIS — M79672 Pain in left foot: Secondary | ICD-10-CM

## 2016-02-23 DIAGNOSIS — M21619 Bunion of unspecified foot: Secondary | ICD-10-CM | POA: Diagnosis not present

## 2016-02-23 DIAGNOSIS — E559 Vitamin D deficiency, unspecified: Secondary | ICD-10-CM

## 2016-02-23 LAB — CBC WITH DIFFERENTIAL/PLATELET
BASOS PCT: 0.4 % (ref 0.0–3.0)
Basophils Absolute: 0 10*3/uL (ref 0.0–0.1)
EOS ABS: 0.2 10*3/uL (ref 0.0–0.7)
EOS PCT: 1.9 % (ref 0.0–5.0)
HEMATOCRIT: 38.1 % (ref 36.0–46.0)
HEMOGLOBIN: 13.3 g/dL (ref 12.0–15.0)
LYMPHS PCT: 23.4 % (ref 12.0–46.0)
Lymphs Abs: 2 10*3/uL (ref 0.7–4.0)
MCHC: 34.8 g/dL (ref 30.0–36.0)
MCV: 89.6 fl (ref 78.0–100.0)
Monocytes Absolute: 0.4 10*3/uL (ref 0.1–1.0)
Monocytes Relative: 5.1 % (ref 3.0–12.0)
Neutro Abs: 6 10*3/uL (ref 1.4–7.7)
Neutrophils Relative %: 69.2 % (ref 43.0–77.0)
Platelets: 275 10*3/uL (ref 150.0–400.0)
RBC: 4.25 Mil/uL (ref 3.87–5.11)
RDW: 13.6 % (ref 11.5–15.5)
WBC: 8.6 10*3/uL (ref 4.0–10.5)

## 2016-02-23 LAB — LIPID PANEL
CHOLESTEROL: 209 mg/dL — AB (ref 0–200)
HDL: 46.6 mg/dL (ref >39.00)
LDL Cholesterol: 144 mg/dL — ABNORMAL HIGH (ref 0–99)
NonHDL: 162.31
TRIGLYCERIDES: 92 mg/dL (ref 0.0–149.0)
Total CHOL/HDL Ratio: 4
VLDL: 18.4 mg/dL (ref 0.0–40.0)

## 2016-02-23 LAB — COMPREHENSIVE METABOLIC PANEL
ALBUMIN: 3.8 g/dL (ref 3.5–5.2)
ALK PHOS: 80 U/L (ref 39–117)
ALT: 14 U/L (ref 0–35)
AST: 14 U/L (ref 0–37)
BILIRUBIN TOTAL: 0.7 mg/dL (ref 0.2–1.2)
BUN: 17 mg/dL (ref 6–23)
CALCIUM: 8.6 mg/dL (ref 8.4–10.5)
CO2: 29 mEq/L (ref 19–32)
CREATININE: 0.9 mg/dL (ref 0.40–1.20)
Chloride: 104 mEq/L (ref 96–112)
GFR: 76.64 mL/min (ref >60.00)
Glucose, Bld: 98 mg/dL (ref 70–99)
Potassium: 4.2 mEq/L (ref 3.5–5.1)
SODIUM: 139 meq/L (ref 135–145)
TOTAL PROTEIN: 6.8 g/dL (ref 6.0–8.3)

## 2016-02-23 LAB — TSH: TSH: 1.72 u[IU]/mL (ref 0.35–4.50)

## 2016-02-23 MED ORDER — SERTRALINE HCL 50 MG PO TABS: 50.0000 mg | ORAL_TABLET | Freq: Every day | ORAL | 3 refills | Status: DC

## 2016-02-23 NOTE — Patient Instructions (Signed)
Encourage weight loss through healthy diet and water aerobic exercise.  Sign medical release to obtain records from previous pcp and oncologist.  Go to basement for labs. You will be called with results.

## 2016-02-23 NOTE — Progress Notes (Signed)
Pre visit review using our clinic review tool, if applicable. No additional management support is needed unless otherwise documented below in the visit note. 

## 2016-02-23 NOTE — Progress Notes (Signed)
Subjective:    Patient ID: Patricia Dean, female    DOB: [birth year], 31 y.o.   MRN: [id number]  Patient presents today for complete physical or establish care (new patient)  Knee Pain   The incident occurred more than 1 week ago. There was no injury mechanism. The pain is present in the left knee and right knee. The quality of the pain is described as aching and burning. The pain has been fluctuating since onset. Associated symptoms comments: Difficulty with climbing stairs. And using stationary bike or prolonged walking. She reports no foreign bodies present. The symptoms are aggravated by weight bearing and movement. She has tried acetaminophen and NSAIDs for the symptoms. The treatment provided mild relief.    Left foot pain: Chronic per patient, worse for last several months, worse with weight bearing, has bunions. No improvement with shoe inserts or wearing wide shoes.  Depression: Controlled with zoloft. Needs refill. No SI or Hi.  Immunizations: (TDAP, Hep C screen, Pneumovax, Influenza, zoster)  Health Maintenance  Topic Date Due  . HIV Screening  02/18/1998  . Pap Smear  02/19/2004  . Flu Shot  04/27/2016*  . Tetanus Vaccine  01/29/2023  *Topic was postponed. The date shown is not the original due date.   Diet:regular Weight:  Wt Readings from Last 3 Encounters:  02/23/16 (!) 345 lb (156.5 kg)   Exercise:unable to exercise due to foot and knee pain Fall Risk:fall 2weeks uago No flowsheet data found. Home Safety:home with boyfriend and his family Depression/Suicide:managed with zoloft No flowsheet data found. No flowsheet data found. Pap Smear (every 54yrs for >21-29 without HPV, every 7yrs for >30-17yrs with HPV):never done, [employer]:up to date Dental:up to date  Sexual History (birth control, marital status, STD):LMP 01/27/17. Never had intercourse  Medications and allergies reviewed with patient and updated if appropriate.  Patient Active Problem List     Diagnosis Date Noted  . Bunion of great toe 02/23/2016  . Foot pain, left 02/23/2016  . Knee pain, bilateral 02/23/2016  . Morbid obesity ([place]) 02/23/2016  . Vitamin D deficiency 02/23/2016  . Hx of unilateral nephrectomy 02/23/2016    Current Outpatient Prescriptions on File Prior to Visit  Medication Sig Dispense Refill  . cholecalciferol (VITAMIN D) 1000 units tablet Take 1,000 Units by mouth daily.     No current facility-administered medications on file prior to visit.     Past Medical History:  Diagnosis Date  . Anxiety   . Depression   . History of chicken pox 1985    Past Surgical History:  Procedure Laterality Date  . CHOLECYSTECTOMY    . KNEE SURGERY Right 2015  . NEPHRECTOMY Left 2011  . WISDOM TOOTH EXTRACTION Bilateral 2005    Social History   Social History  . Marital status: Single    Spouse name: N/A  . Number of children: N/A  . Years of education: N/A   Occupational History  . Community Support Other    [employer]    Social History Main Topics  . Smoking status: Never Smoker  . Smokeless tobacco: Never Used  . Alcohol use No  . Drug use: No  . Sexual activity: No   Other Topics Concern  . None   Social History Narrative  . None    Family History  Problem Relation Age of Onset  . Heart attack Mother 42  . Diabetes Mother   . Endometrial cancer Mother   . Thyroid disease Mother   .  Alcoholism Father   . Clotting disorder Sister 26    Factor V deficiency  . Asperger's syndrome Brother   . Osteoarthritis Maternal Grandmother   . Hypertension Maternal Grandmother   . Heart attack Maternal Grandmother   . Diabetes Maternal Grandmother   . Heart failure Maternal Grandmother   . Hypertension Maternal Grandfather   . Diabetes Maternal Grandfather   . Heart failure Maternal Grandfather   . Osteoarthritis Paternal Grandmother   . Hypertension Paternal Grandmother   . Diabetes Paternal Grandmother   . Heart failure  Paternal Grandmother   . Hypertension Paternal Grandfather   . Heart failure Paternal Grandfather   . Autism Paternal Uncle   . Asperger's syndrome Paternal Uncle         Review of Systems  Constitutional: Negative for fever, malaise/fatigue and weight loss.  HENT: Negative for congestion and sore throat.   Eyes:       Negative for visual changes  Respiratory: Negative for cough and shortness of breath.   Cardiovascular: Negative for chest pain, palpitations and leg swelling.  Gastrointestinal: Negative for blood in stool, constipation, diarrhea and heartburn.  Genitourinary: Negative for dysuria, frequency and urgency.  Musculoskeletal: Positive for falls and joint pain. Negative for myalgias.  Skin: Negative for rash.  Neurological: Negative for dizziness, sensory change and headaches.  Endo/Heme/Allergies: Does not bruise/bleed easily.  Psychiatric/Behavioral: Negative for depression, substance abuse and suicidal ideas. The patient is not nervous/anxious.     Objective:   Vitals:   02/23/16 0923  BP: 128/70  Pulse: 75  Temp: 97.5 F (36.4 C)    Body mass index is 50.95 kg/m.   Physical Examination:  Physical Exam  Constitutional: She is oriented to person, place, and time and well-developed, well-nourished, and in no distress. No distress.  HENT:  Right Ear: External ear normal.  Left Ear: External ear normal.  Nose: Nose normal.  Mouth/Throat: No oropharyngeal exudate.  Eyes: Conjunctivae and EOM are normal. Pupils are equal, round, and reactive to light. No scleral icterus.  Neck: Normal range of motion. Neck supple. No thyromegaly present.  Cardiovascular: Normal rate, regular rhythm, normal heart sounds and intact distal pulses.   Pulmonary/Chest: Effort normal and breath sounds normal. She exhibits no tenderness and no bony tenderness. Right breast exhibits inverted nipple. Right breast exhibits no mass, no nipple discharge, no skin change and no tenderness.  Left breast exhibits inverted nipple. Left breast exhibits no mass, no nipple discharge, no skin change and no tenderness. Breasts are symmetrical.  Abdominal: Soft. Bowel sounds are normal. She exhibits no distension. There is no tenderness.  Genitourinary: Rectum normal and vulva normal. Cervix exhibits no motion tenderness.  Genitourinary Comments: Deferred by patient  Musculoskeletal: Normal range of motion. She exhibits tenderness. She exhibits no edema.       Right knee: She exhibits swelling and effusion. She exhibits normal range of motion, no erythema and normal patellar mobility. Tenderness found. Medial joint line and lateral joint line tenderness noted. No patellar tendon tenderness noted.       Left knee: She exhibits effusion and bony tenderness. She exhibits normal range of motion, no swelling and normal patellar mobility. Tenderness found. Medial joint line and lateral joint line tenderness noted. No patellar tendon tenderness noted.       Right foot: There is tenderness and bony tenderness. There is normal range of motion, no swelling and no crepitus.       Left foot: There is tenderness, bony tenderness and  crepitus. There is no swelling.  Lymphadenopathy:    She has no cervical adenopathy.  Neurological: She is alert and oriented to person, place, and time. No cranial nerve deficit. Gait normal.  Skin: Skin is warm and dry.  Psychiatric: Affect and judgment normal.  Vitals reviewed.   ASSESSMENT and PLAN:  Haivyn was seen today for establish care.  Diagnoses and all orders for this visit:  Encounter for preventative adult health care exam with abnormal findings -     CBC w/Diff; Future -     Comprehensive metabolic panel; Future -     Lipid panel; Future -     TSH; Future  Bunion of great toe -     Ambulatory referral to Podiatry  Foot pain, left -     Ambulatory referral to Podiatry -     Arthritis Panel; Future  Chronic pain of both knees -     Arthritis  Panel; Future -     AMB referral to orthopedics -     Cancel: DG Knee 3 Views Right; Future -     Cancel: DG Knee 3 Views Left; Future  Morbid obesity (HCC)  Vitamin D deficiency -     Vitamin D 1,25 dihydroxy; Future  Hx of unilateral nephrectomy -     Comprehensive metabolic panel; Future  Adjustment disorder with mixed anxiety and depressed mood -     sertraline (ZOLOFT) 50 MG tablet; Take 1 tablet (50 mg total) by mouth daily.    Morbid obesity ([place]) Consider use of phentermine or contrave.     Recent Results (from the past 2160 hour(s))  CBC w/Diff     Status: None   Collection Time: 02/23/16 10:35 AM  Result Value Ref Range   WBC 8.6 4.0 - 10.5 K/uL   RBC 4.25 3.87 - 5.11 Mil/uL   Hemoglobin 13.3 12.0 - 15.0 g/dL   HCT 38.1 36.0 - 46.0 %   MCV 89.6 78.0 - 100.0 fl   MCHC 34.8 30.0 - 36.0 g/dL   RDW 13.6 11.5 - 15.5 %   Platelets 275.0 150.0 - 400.0 K/uL   Neutrophils Relative % 69.2 43.0 - 77.0 %   Lymphocytes Relative 23.4 12.0 - 46.0 %   Monocytes Relative 5.1 3.0 - 12.0 %   Eosinophils Relative 1.9 0.0 - 5.0 %   Basophils Relative 0.4 0.0 - 3.0 %   Neutro Abs 6.0 1.4 - 7.7 K/uL   Lymphs Abs 2.0 0.7 - 4.0 K/uL   Monocytes Absolute 0.4 0.1 - 1.0 K/uL   Eosinophils Absolute 0.2 0.0 - 0.7 K/uL   Basophils Absolute 0.0 0.0 - 0.1 K/uL  Comprehensive metabolic panel     Status: None   Collection Time: 02/23/16 10:35 AM  Result Value Ref Range   Sodium 139 135 - 145 mEq/L   Potassium 4.2 3.5 - 5.1 mEq/L   Chloride 104 96 - 112 mEq/L   CO2 29 19 - 32 mEq/L   Glucose, Bld 98 70 - 99 mg/dL   BUN 17 6 - 23 mg/dL   Creatinine, Ser 0.90 0.40 - 1.20 mg/dL   Total Bilirubin 0.7 0.2 - 1.2 mg/dL   Alkaline Phosphatase 80 39 - 117 U/L   AST 14 0 - 37 U/L   ALT 14 0 - 35 U/L   Total Protein 6.8 6.0 - 8.3 g/dL   Albumin 3.8 3.5 - 5.2 g/dL   Calcium 8.6 8.4 - 10.5 mg/dL   GFR 76.64 >60.00 mL/min  Lipid panel     Status: Abnormal   Collection Time: 02/23/16 10:35 AM    Result Value Ref Range   Cholesterol 209 (H) 0 - 200 mg/dL    Comment: ATP III Classification       Desirable:  < 200 mg/dL               Borderline High:  200 - 239 mg/dL          High:  > = 240 mg/dL   Triglycerides 92.0 0.0 - 149.0 mg/dL    Comment: Normal:  <150 mg/dLBorderline High:  150 - 199 mg/dL   HDL 46.60 >39.00 mg/dL   VLDL 18.4 0.0 - 40.0 mg/dL   LDL Cholesterol 144 (H) 0 - 99 mg/dL   Total CHOL/HDL Ratio 4     Comment:                Men          Women1/2 Average Risk     3.4          3.3Average Risk          5.0          4.42X Average Risk          9.6          7.13X Average Risk          15.0          11.0                       NonHDL 162.31     Comment: NOTE:  Non-HDL goal should be 30 mg/dL higher than patient's LDL goal (i.e. LDL goal of < 70 mg/dL, would have non-HDL goal of < 100 mg/dL)  TSH     Status: None   Collection Time: 02/23/16 10:35 AM  Result Value Ref Range   TSH 1.72 0.35 - 4.50 uIU/mL   Follow up: Return in about 1 month (around 03/25/2016) for weight loss management with medication.  Wilfred Lacy, NP

## 2016-02-23 NOTE — Assessment & Plan Note (Signed)
Consider use of phentermine or contrave.

## 2016-02-26 LAB — VITAMIN D 1,25 DIHYDROXY
VITAMIN D 1, 25 (OH) TOTAL: 29 pg/mL (ref 18–72)
VITAMIN D3 1, 25 (OH): 29 pg/mL
Vitamin D2 1, 25 (OH)2: <8 pg/mL

## 2016-03-13 ENCOUNTER — Ambulatory Visit (INDEPENDENT_AMBULATORY_CARE_PROVIDER_SITE_OTHER): Payer: BLUE CROSS/BLUE SHIELD | Admitting: Podiatry

## 2016-03-13 ENCOUNTER — Ambulatory Visit (INDEPENDENT_AMBULATORY_CARE_PROVIDER_SITE_OTHER): Payer: BLUE CROSS/BLUE SHIELD

## 2016-03-13 VITALS — HR 76 | Resp 16 | Ht 69.0 in | Wt 341.0 lb

## 2016-03-13 DIAGNOSIS — M779 Enthesopathy, unspecified: Secondary | ICD-10-CM

## 2016-03-13 DIAGNOSIS — M722 Plantar fascial fibromatosis: Secondary | ICD-10-CM

## 2016-03-13 DIAGNOSIS — M21619 Bunion of unspecified foot: Secondary | ICD-10-CM | POA: Diagnosis not present

## 2016-03-13 MED ORDER — TRIAMCINOLONE ACETONIDE 10 MG/ML IJ SUSP
10.0000 mg | Freq: Once | INTRAMUSCULAR | Status: AC
  Administered 2016-03-13: 10 mg

## 2016-03-13 NOTE — Patient Instructions (Signed)
Bunionectomy A bunionectomy is a surgical procedure to remove a bunion. A bunion is a visible bump of bone on the inside of your foot where your big toe meets the rest of your foot. A bunion can develop when pressure turns this bone (first metatarsal) toward the other toes. Shoes that are too tight are the most common cause of bunions. Bunions can also be caused by diseases, such as arthritis and polio. You may need a bunionectomy if your bunion is very large and painful or it affects your ability to walk. Tell a health care provider about:  Any allergies you have.  All medicines you are taking, including vitamins, herbs, eye drops, creams, and over-the-counter medicines.  Any problems you or family members have had with anesthetic medicines.  Any blood disorders you have.  Any surgeries you have had.  Any medical conditions you have. What are the risks? Generally, this is a safe procedure. However, problems may occur, including:  Infection.  Pain.  Nerve damage.  Bleeding or blood clots.  Reactions to medicines.  Numbness, stiffness, or arthritis in your toe.  Foot problems that continue even after the procedure. What happens before the procedure?  Ask your health care provider about:  Changing or stopping your regular medicines. This is especially important if you are taking diabetes medicines or blood thinners.  Taking medicines such as aspirin and ibuprofen. These medicines can thin your blood. Do not take these medicines before your procedure if your health care provider instructs you not to.  Do not drink alcohol before the procedure as directed by your health care provider.  Do not use tobacco products, including cigarettes, chewing tobacco, or electronic cigarettes, before the procedure as directed by your health care provider. If you need help quitting, ask your health care provider.  Ask your health care provider what kind of medicine you will be given during  your procedure. A bunionectomy may be done using one of these:  A medicine that numbs the area (local anesthetic).  A medicine that makes you go to sleep (general anesthetic). If you will be given general anesthetic, do not eat or drink anything after midnight on the night before the procedure or as directed by your health care provider. What happens during the procedure?  An IV tube may be inserted into a vein.  You will be given local anesthetic or general anesthetic.  The surgeon will make a cut (incision) over the enlarged area at the first joint of the big toe. The surgeon will remove the bunion.  You may have more than one incision if any of the bones in your big toe need to be moved. A bone itself may need to be cut.  Sometimes the tissues around the big toe may also need to be cut then tightened or loosened to reposition the toe.  Screws or other hardware may be used to keep your foot in thecorrect position.  The incision will be closed with stitches (sutures) and covered with adhesive strips or another type of bandage (dressing). What happens after the procedure?  You may spend some time in a recovery area.  Your blood pressure, heart rate, breathing rate, and blood oxygen level will be monitored often until the medicines you were given have worn off. This information is not intended to replace advice given to you by your health care provider. Make sure you discuss any questions you have with your health care provider. Document Released: 12/28/2004 Document Revised: 06/22/2015 Document Reviewed: 09/01/2013   [employer]vier Interactive Patient Education  2017 [employer]  Plantar Fasciitis (Heel Spur Syndrome) with Rehab The plantar fascia is a fibrous, ligament-like, soft-tissue structure that spans the bottom of the foot. Plantar fasciitis is a condition that causes pain in the foot due to inflammation of the tissue. SYMPTOMS   Pain and tenderness on the underneath side of the  foot.  Pain that worsens with standing or walking. CAUSES  Plantar fasciitis is caused by irritation and injury to the plantar fascia on the underneath side of the foot. Common mechanisms of injury include:  Direct trauma to bottom of the foot.  Damage to a small nerve that runs under the foot where the main fascia attaches to the heel bone.  Stress placed on the plantar fascia due to bone spurs. RISK INCREASES WITH:   Activities that place stress on the plantar fascia (running, jumping, pivoting, or cutting).  Poor strength and flexibility.  Improperly fitted shoes.  Tight calf muscles.  Flat feet.  Failure to warm-up properly before activity.  Obesity. PREVENTION  Warm up and stretch properly before activity.  Allow for adequate recovery between workouts.  Maintain physical fitness:  Strength, flexibility, and endurance.  Cardiovascular fitness.  Maintain a health body weight.  Avoid stress on the plantar fascia.  Wear properly fitted shoes, including arch supports for individuals who have flat feet.  PROGNOSIS  If treated properly, then the symptoms of plantar fasciitis usually resolve without surgery. However, occasionally surgery is necessary.  RELATED COMPLICATIONS   Recurrent symptoms that may result in a chronic condition.  Problems of the lower back that are caused by compensating for the injury, such as limping.  Pain or weakness of the foot during push-off following surgery.  Chronic inflammation, scarring, and partial or complete fascia tear, occurring more often from repeated injections.  TREATMENT  Treatment initially involves the use of ice and medication to help reduce pain and inflammation. The use of strengthening and stretching exercises may help reduce pain with activity, especially stretches of the Achilles tendon. These exercises may be performed at home or with a therapist. Your caregiver may recommend that you use heel cups of arch  supports to help reduce stress on the plantar fascia. Occasionally, corticosteroid injections are given to reduce inflammation. If symptoms persist for greater than 6 months despite non-surgical (conservative), then surgery may be recommended.   MEDICATION   If pain medication is necessary, then nonsteroidal anti-inflammatory medications, such as aspirin and ibuprofen, or other minor pain relievers, such as acetaminophen, are often recommended.  Do not take pain medication within 7 days before surgery.  Prescription pain relievers may be given if deemed necessary by your caregiver. Use only as directed and only as much as you need.  Corticosteroid injections may be given by your caregiver. These injections should be reserved for the most serious cases, because they may only be given a certain number of times.  HEAT AND COLD  Cold treatment (icing) relieves pain and reduces inflammation. Cold treatment should be applied for 10 to 15 minutes every 2 to 3 hours for inflammation and pain and immediately after any activity that aggravates your symptoms. Use ice packs or massage the area with a piece of ice (ice massage).  Heat treatment may be used prior to performing the stretching and strengthening activities prescribed by your caregiver, physical therapist, or athletic trainer. Use a heat pack or soak the injury in warm water.  SEEK IMMEDIATE MEDICAL CARE IF:  Treatment seems to  offer no benefit, or the condition worsens.  Any medications produce adverse side effects.  EXERCISES- RANGE OF MOTION (ROM) AND STRETCHING EXERCISES - Plantar Fasciitis (Heel Spur Syndrome) These exercises may help you when beginning to rehabilitate your injury. Your symptoms may resolve with or without further involvement from your physician, physical therapist or athletic trainer. While completing these exercises, remember:   Restoring tissue flexibility helps normal motion to return to the joints. This allows  healthier, less painful movement and activity.  An effective stretch should be held for at least 30 seconds.  A stretch should never be painful. You should only feel a gentle lengthening or release in the stretched tissue.  RANGE OF MOTION - Toe Extension, Flexion  Sit with your right / left leg crossed over your opposite knee.  Grasp your toes and gently pull them back toward the top of your foot. You should feel a stretch on the bottom of your toes and/or foot.  Hold this stretch for 10 seconds.  Now, gently pull your toes toward the bottom of your foot. You should feel a stretch on the top of your toes and or foot.  Hold this stretch for 10 seconds. Repeat  times. Complete this stretch 3 times per day.   RANGE OF MOTION - Ankle Dorsiflexion, Active Assisted  Remove shoes and sit on a chair that is preferably not on a carpeted surface.  Place right / left foot under knee. Extend your opposite leg for support.  Keeping your heel down, slide your right / left foot back toward the chair until you feel a stretch at your ankle or calf. If you do not feel a stretch, slide your bottom forward to the edge of the chair, while still keeping your heel down.  Hold this stretch for 10 seconds. Repeat 3 times. Complete this stretch 2 times per day.   STRETCH  Gastroc, Standing  Place hands on wall.  Extend right / left leg, keeping the front knee somewhat bent.  Slightly point your toes inward on your back foot.  Keeping your right / left heel on the floor and your knee straight, shift your weight toward the wall, not allowing your back to arch.  You should feel a gentle stretch in the right / left calf. Hold this position for 10 seconds. Repeat 3 times. Complete this stretch 2 times per day.  STRETCH  Soleus, Standing  Place hands on wall.  Extend right / left leg, keeping the other knee somewhat bent.  Slightly point your toes inward on your back foot.  Keep your right / left  heel on the floor, bend your back knee, and slightly shift your weight over the back leg so that you feel a gentle stretch deep in your back calf.  Hold this position for 10 seconds. Repeat 3 times. Complete this stretch 2 times per day.  STRETCH  Gastrocsoleus, Standing  Note: This exercise can place a lot of stress on your foot and ankle. Please complete this exercise only if specifically instructed by your caregiver.   Place the ball of your right / left foot on a step, keeping your other foot firmly on the same step.  Hold on to the wall or a rail for balance.  Slowly lift your other foot, allowing your body weight to press your heel down over the edge of the step.  You should feel a stretch in your right / left calf.  Hold this position for 10 seconds.  Repeat  this exercise with a slight bend in your right / left knee. Repeat 3 times. Complete this stretch 2 times per day.   STRENGTHENING EXERCISES - Plantar Fasciitis (Heel Spur Syndrome)  These exercises may help you when beginning to rehabilitate your injury. They may resolve your symptoms with or without further involvement from your physician, physical therapist or athletic trainer. While completing these exercises, remember:   Muscles can gain both the endurance and the strength needed for everyday activities through controlled exercises.  Complete these exercises as instructed by your physician, physical therapist or athletic trainer. Progress the resistance and repetitions only as guided.  STRENGTH - Towel Curls  Sit in a chair positioned on a non-carpeted surface.  Place your foot on a towel, keeping your heel on the floor.  Pull the towel toward your heel by only curling your toes. Keep your heel on the floor. Repeat 3 times. Complete this exercise 2 times per day.  STRENGTH - Ankle Inversion  Secure one end of a rubber exercise band/tubing to a fixed object (table, pole). Loop the other end around your foot just  before your toes.  Place your fists between your knees. This will focus your strengthening at your ankle.  Slowly, pull your big toe up and in, making sure the band/tubing is positioned to resist the entire motion.  Hold this position for 10 seconds.  Have your muscles resist the band/tubing as it slowly pulls your foot back to the starting position. Repeat 3 times. Complete this exercises 2 times per day.  Document Released: 01/14/2005 Document Revised: 04/08/2011 Document Reviewed: 04/28/2008 [facility] Patient Information 2014 [place], [place].

## 2016-03-13 NOTE — Progress Notes (Signed)
   Subjective:    Patient ID: Patricia Dean, female    DOB: 03-Sep-1985, 31 y.o.   MRN: [id number]  HPI  Chief Complaint  Patient presents with  . Foot Pain    Left; Top of foot and bottom of heel x "for over a year". Pt states that her orthopedic doctor dx her with P/F in the past.   . Bunions    BL;  . High Arches    BL; Pt states that if she doesn't wear supportive shoes that the areas hurt.        Review of Systems     Objective:   Physical Exam        Assessment & Plan:

## 2016-03-13 NOTE — Progress Notes (Signed)
Subjective:     Patient ID: Patricia Dean, female   DOB: 16-Dec-1985, 31 y.o.   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 Also is complaining of forefoot pain left of approximate 6 months and history of plantar fasciitis times approximately 3 years which comes and goes and is worse left over right   Review of Systems  All other systems reviewed and are negative.      Objective:   Physical Exam  Constitutional: She is oriented to person, place, and time.  Cardiovascular: Intact distal pulses.   Musculoskeletal: Normal range of motion.  Neurological: She is oriented to person, place, and time.  Skin: Skin is warm.  Nursing note and vitals reviewed.  neurovascular status intact muscle strength was adequate range of motion within normal limits with patient found to have tenderness in the left plantar fascia over right inflammation and pain in the second metatarsal phalangeal joint left and significant structural bunion deformity left over right foot with deviation the hallux against second toe and redness around the area. Found have good digital perfusion and well oriented 3     Assessment:     Chronic plantar fasciitis left with obesity is complicating factor along with bunion deformity that's painful left over right and capsulitis second MPJ left    Plan:     H&P and x-rays reviewed. Today I did inject the left plantar fashion 3 mg Kenalog 5 mill grams Xylocaine to proximal for block left and aspirated the joint getting out a small amount of fluid and injected quarter cc deck some Kenalog. I discussed bunion correction the possibility for correction of the joint surface and he'll depending on response and she's going to have this done the third week of March when she returns from [facility]. Patient will be seen  back next week to go over in greater detail what we'll be necessary surgically  X-ray report indicates large deformity of the first metatarsal left with inflammation around the second MPJ and also deformity of the right not to the same degree

## 2016-03-21 ENCOUNTER — Encounter: Payer: Self-pay | Admitting: Podiatry

## 2016-03-21 ENCOUNTER — Ambulatory Visit (INDEPENDENT_AMBULATORY_CARE_PROVIDER_SITE_OTHER): Payer: BLUE CROSS/BLUE SHIELD | Admitting: Podiatry

## 2016-03-21 DIAGNOSIS — M779 Enthesopathy, unspecified: Secondary | ICD-10-CM | POA: Diagnosis not present

## 2016-03-21 DIAGNOSIS — M21619 Bunion of unspecified foot: Secondary | ICD-10-CM

## 2016-03-21 NOTE — Patient Instructions (Signed)
Pre-Operative Instructions  Congratulations, you have decided to take an important step to improving your quality of life.  You can be assured that the doctors of [facility] will be with you every step of the way.  1. Plan to be at the surgery center/hospital at least 1 (one) hour prior to your scheduled time unless otherwise directed by the surgical center/hospital staff.  You must have a responsible adult accompany you, remain during the surgery and drive you home.  Make sure you have directions to the surgical center/hospital and know how to get there on time. 2. For hospital based surgery you will need to obtain a history and physical form from your family physician within 1 month prior to the date of surgery- we will give you a form for you primary physician.  3. We make every effort to accommodate the date you request for surgery.  There are however, times where surgery dates or times have to be moved.  We will contact you as soon as possible if a change in schedule is required.   4. No Aspirin/Ibuprofen for one week before surgery.  If you are on aspirin, any non-steroidal anti-inflammatory medications (Mobic, Aleve, Ibuprofen) you should stop taking it 7 days prior to your surgery.  You make take Tylenol  For pain prior to surgery.  5. Medications- If you are taking daily heart and blood pressure medications, seizure, reflux, allergy, asthma, anxiety, pain or diabetes medications, make sure the surgery center/hospital is aware before the day of surgery so they may notify you which medications to take or avoid the day of surgery. 6. No food or drink after midnight the night before surgery unless directed otherwise by surgical center/hospital staff. 7. No alcoholic beverages 24 hours prior to surgery.  No smoking 24 hours prior to or 24 hours after surgery. 8. Wear loose pants or shorts- loose enough to fit over bandages, boots, and casts. 9. No slip on shoes, sneakers are best. 10. Bring  your boot with you to the surgery center/hospital.  Also bring crutches or a walker if your physician has prescribed it for you.  If you do not have this equipment, it will be provided for you after surgery. 11. If you have not been contracted by the surgery center/hospital by the day before your surgery, call to confirm the date and time of your surgery. 12. Leave-time from work may vary depending on the type of surgery you have.  Appropriate arrangements should be made prior to surgery with your employer. 13. Prescriptions will be provided immediately following surgery by your doctor.  Have these filled as soon as possible after surgery and take the medication as directed. 14. Remove nail polish on the operative foot. 15. Wash the night before surgery.  The night before surgery wash the foot and leg well with the antibacterial soap provided and water paying special attention to beneath the toenails and in between the toes.  Rinse thoroughly with water and dry well with a towel.  Perform this wash unless told not to do so by your physician.  Enclosed: 1 Ice pack (please put in freezer the night before surgery)   1 Hibiclens skin cleaner   Pre-op Instructions  If you have any questions regarding the instructions, do not hesitate to call our office.  [place]: [address] [place], [place] [address] [phone]  [place]: [address] [place], [place] [address] [phone]  [place]: [address]  [place], [place] [address] [phone]   Dr.   Norman Regal DPM, Dr. Matthew Wagoner DPM, Dr. M. Todd Hyatt DPM, Dr. Titorya Stover DPM 

## 2016-03-21 NOTE — Progress Notes (Signed)
Subjective:     Patient ID: Patricia Dean, female   DOB: April 23, 1985, 31 y.o.   MRN: [id number]  HPI patient states that my joint is doing quite a bit better that you worked on and I want to get my bunion fixed in March left over right   Review of Systems     Objective:   Physical Exam Neurovascular status intact with significant structural bunion deformity left over right that's failed to respond to conservative wider shoes soaks and anti-inflammatories. 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 Patient wants surgery and signs consent form after extensive review and scheduled for outpatient surgery understanding all risk and the fact that recovery can take a proximally 6 months and that she will be utilizing a boot and then surgical shoe for proximally 4 weeks with boot being dispensed today with all instructions on usage. Reappoint for [place] to recheck again in 4 surgery in the next 3 weeks and is encouraged to call with any questions prior to procedure

## 2016-03-23 ENCOUNTER — Encounter: Payer: Self-pay | Admitting: Nurse Practitioner

## 2016-03-29 ENCOUNTER — Encounter: Payer: Self-pay | Admitting: Nurse Practitioner

## 2016-03-29 ENCOUNTER — Ambulatory Visit (INDEPENDENT_AMBULATORY_CARE_PROVIDER_SITE_OTHER): Payer: BLUE CROSS/BLUE SHIELD | Admitting: Nurse Practitioner

## 2016-03-29 ENCOUNTER — Telehealth: Payer: Self-pay | Admitting: Nurse Practitioner

## 2016-03-29 DIAGNOSIS — F4323 Adjustment disorder with mixed anxiety and depressed mood: Secondary | ICD-10-CM | POA: Insufficient documentation

## 2016-03-29 MED ORDER — PHENTERMINE HCL 37.5 MG PO TABS: 37.5000 mg | ORAL_TABLET | Freq: Every day | ORAL | 0 refills | Status: DC

## 2016-03-29 NOTE — Progress Notes (Signed)
Pre visit review using our clinic review tool, if applicable. No additional management support is needed unless otherwise documented below in the visit note. 

## 2016-03-29 NOTE — Telephone Encounter (Signed)
PA for Phentermine 37.5 tab started, waiting for response.  Key: [id number]

## 2016-03-29 NOTE — Patient Instructions (Addendum)
Unable to start phentermine now due to upcoming foot surgery. Start phentermine first week of April. Make follow up appt 1week after start Phentermine.  She was informed about need for birth control if she becomes sexually active while on current medications.  Phentermine sustained-release capsules What is this medicine? PHENTERMINE (FEN ter meen) decreases your appetite. It is used with a reduced calorie diet and exercise to help you lose weight. This medicine may be used for other purposes; ask your health care provider or pharmacist if you have questions. COMMON BRAND NAME(S): Ionamin, Pro-Fast What should I tell my health care provider before I take this medicine? They need to know if you have any of these conditions: -agitation -glaucoma -heart disease -high blood pressure -history of substance abuse -lung disease called Primary Pulmonary Hypertension (PPH) -taken an MAOI like Carbex, Eldepryl, Marplan, Nardil, or Parnate in last 14 days -thyroid disease -an unusual or allergic reaction to phentermine, other medicines, foods, dyes, or preservatives -pregnant or trying to get pregnant -breast-feeding How should I use this medicine? Take this medicine by mouth with a glass of water. Follow the directions on the prescription label. This medicine is usually taken before breakfast or at least 10 to 14 hours before going to bed. Avoid taking this medicine in the evening. It may interfere with sleep. Swallow whole. Do not open or chew the capsules. Take your doses at regular intervals. Do not take your medicine more often than directed. Talk to your pediatrician regarding the use of this medicine in children. Special care may be needed. Overdosage: If you think you have taken too much of this medicine contact a poison control center or emergency room at once. NOTE: This medicine is only for you. Do not share this medicine with others. What if I miss a dose? If you miss a dose, take it as  soon as you can. If it is almost time for your next dose, take only that dose. Do not take double or extra doses. What may interact with this medicine? Do not take this medicine with any of the following medications: -duloxetine -MAOIs like Carbex, Eldepryl, Marplan, Nardil, and Parnate -medicines for colds or breathing difficulties like pseudoephedrine or phenylephrine -procarbazine -sibutramine -SSRIs like citalopram, escitalopram, fluoxetine, fluvoxamine, paroxetine, and sertraline -stimulants like dexmethylphenidate, methylphenidate or modafinil -venlafaxine This medicine may also interact with the following medications: -medicines for diabetes This list may not describe all possible interactions. Give your health care provider a list of all the medicines, herbs, non-prescription drugs, or dietary supplements you use. Also tell them if you smoke, drink alcohol, or use illegal drugs. Some items may interact with your medicine. What should I watch for while using this medicine? Notify your physician immediately if you become short of breath while doing your normal activities. Do not take this medicine within 6 hours of bedtime. It can keep you from getting to sleep. Avoid drinks that contain caffeine and try to stick to a regular bedtime every night. This medicine was intended to be used in addition to a healthy diet and exercise. The best results are achieved this way. This medicine is only indicated for short-term use. Eventually your weight loss may level out. At that point, the drug will only help you maintain your new weight. Do not increase or in any way change your dose without consulting your doctor. You may get drowsy or dizzy. Do not drive, use machinery, or do anything that needs mental alertness until you know how this medicine  affects you. Do not stand or sit up quickly, especially if you are an older patient. This reduces the risk of dizzy or fainting spells. Alcohol may increase  dizziness and drowsiness. Avoid alcoholic drinks. What side effects may I notice from receiving this medicine? Side effects that you should report to your doctor or health care professional as soon as possible: -chest pain, palpitations -depression or severe changes in mood -increased blood pressure -irritability -nervousness or restlessness -severe dizziness -shortness of breath -problems urinating -unusual swelling of the legs -vomiting Side effects that usually do not require medical attention (report to your doctor or health care professional if they continue or are bothersome): -blurred vision or other eye problems -changes in sexual ability or desire -constipation or diarrhea -difficulty sleeping -dry mouth or unpleasant taste -headache -nausea This list may not describe all possible side effects. Call your doctor for medical advice about side effects. You may report side effects to FDA at 1-800-FDA-1088. Where should I keep my medicine? Keep out of the reach of children. This medicine can be abused. Keep your medicine in a safe place to protect it from theft. Do not share this medicine with anyone. Selling or giving away this medicine is dangerous and against the law. This medicine may cause accidental overdose and death if taken by other adults, children, or pets. Mix any unused medicine with a substance like cat litter or coffee grounds. Then throw the medicine away in a sealed container like a sealed bag or a coffee can with a lid. Do not use the medicine after the expiration date. Store at room temperature between 20 and 25 degrees C (68 and 77 degrees F). Keep container tightly closed. NOTE: This sheet is a summary. It may not cover all possible information. If you have questions about this medicine, talk to your doctor, pharmacist, or health care provider.  2018 Elsevier/Gold Standard (2013-10-05 16:19:17)

## 2016-03-29 NOTE — Progress Notes (Signed)
Subjective:  Patient ID: Patricia Dean, female    DOB: 22-Aug-1985  Age: 31 y.o. MRN: [id number]  CC: Weight Loss (weight loss consult. )   HPI Ms. Routson presents for weight loss medication and referral to medical weight loss clinic. She is unable to exercise regularly due to pain in knees and foot (chronic). She has upcoming foot surgery 04/16/16. She also has upcoming appt with ortho. She is cutting portion size and maintained low fat diet with no success. Reports she is a virgin.  Outpatient Medications Prior to Visit  Medication Sig Dispense Refill  . Calcium Carbonate-Vit D-Min (CALCIUM 1200 PO) Take by mouth.    . cholecalciferol (VITAMIN D) 1000 units tablet Take 1,000 Units by mouth daily.    . sertraline (ZOLOFT) 50 MG tablet Take 1 tablet (50 mg total) by mouth daily. 30 tablet 3   No facility-administered medications prior to visit.     ROS Review of Systems  Constitutional: Negative for fever, malaise/fatigue and weight loss.  HENT: Negative for congestion and sore throat.   Eyes:       Negative for visual changes  Respiratory: Negative for cough and shortness of breath.   Cardiovascular: Negative for chest pain, palpitations and leg swelling.  Gastrointestinal: Negative for blood in stool, constipation, diarrhea and heartburn.  Genitourinary: Negative for dysuria, frequency and urgency.  Musculoskeletal: Positive for joint pain. Negative for falls and myalgias.  Skin: Negative for rash.  Neurological: Negative for dizziness, sensory change and headaches.  Endo/Heme/Allergies: Does not bruise/bleed easily.  Psychiatric/Behavioral: Negative for depression, substance abuse and suicidal ideas. The patient is not nervous/anxious.     Objective:  BP 122/80   Pulse 73   Temp 97.9 F (36.6 C)   Ht 5\' 9"  (1.753 m)   Wt (!) 348 lb (157.9 kg)   SpO2 99%   BMI 51.39 kg/m   BP Readings from Last 3 Encounters:  03/29/16 122/80  02/23/16 128/70  02/11/16 141/90     Wt Readings from Last 3 Encounters:  03/29/16 (!) 348 lb (157.9 kg)  03/13/16 (!) 341 lb (154.7 kg)  02/23/16 (!) 345 lb (156.5 kg)    Physical Exam  Constitutional: She is oriented to person, place, and time. No distress.  Neck: Normal range of motion. Neck supple.  Cardiovascular: Normal rate, regular rhythm and normal heart sounds.   No murmur heard. Pulmonary/Chest: Effort normal and breath sounds normal.  Abdominal: She exhibits no distension.  Musculoskeletal: She exhibits no edema.  Neurological: She is alert and oriented to person, place, and time.  Skin: Skin is warm and dry.  Vitals reviewed.   Lab Results  Component Value Date   WBC 8.6 02/23/2016   HGB 13.3 02/23/2016   HCT 38.1 02/23/2016   PLT 275.0 02/23/2016   GLUCOSE 98 02/23/2016   CHOL 209 (H) 02/23/2016   TRIG 92.0 02/23/2016   HDL 46.60 02/23/2016   LDLCALC 144 (H) 02/23/2016   ALT 14 02/23/2016   AST 14 02/23/2016   NA 139 02/23/2016   K 4.2 02/23/2016   CL 104 02/23/2016   CREATININE 0.90 02/23/2016   BUN 17 02/23/2016   CO2 29 02/23/2016   TSH 1.72 02/23/2016    Dg Knee Complete 4 Views Left  Result Date: 02/23/2016 CLINICAL DATA:  Chronic knee pain. EXAM: LEFT KNEE - COMPLETE 4+ VIEW COMPARISON:  No recent prior . FINDINGS: No acute bony or joint abnormality identified. No evidence of fracture or dislocation. IMPRESSION: No acute  abnormality. Electronically Signed   By: Marcello Moores  Register   On: 02/23/2016 13:02   Dg Knee Complete 4 Views Right  Result Date: 02/23/2016 CLINICAL DATA:  Fall. EXAM: RIGHT KNEE - COMPLETE 4+ VIEW COMPARISON:  No recent prior . FINDINGS: No acute bony or joint abnormality identified. No evidence of fracture or dislocation. IMPRESSION: No acute abnormality. Electronically Signed   By: Marcello Moores  Register   On: 02/23/2016 12:48    Assessment & Plan:   [place] was seen today for weight loss.  Diagnoses and all orders for this visit:  Morbid obesity ([place]) -      phentermine (ADIPEX-P) 37.5 MG tablet; Take 1 tablet (37.5 mg total) by mouth daily before breakfast.   I am having Ms. Bayman start on phentermine. I am also having her maintain her cholecalciferol, sertraline, and Calcium Carbonate-Vit D-Min (CALCIUM 1200 PO).  Meds ordered this encounter  Medications  . phentermine (ADIPEX-P) 37.5 MG tablet    Sig: Take 1 tablet (37.5 mg total) by mouth daily before breakfast.    Dispense:  30 tablet    Refill:  0    Order Specific Question:   Supervising Provider    Answer:   Cassandria Anger [[id number]]    Follow-up: Return in about 5 weeks (around 05/06/2016) for weight loss.  Wilfred Lacy, NP

## 2016-04-05 NOTE — Telephone Encounter (Signed)
PA was deny from [name].   Left vm for pt to call back, need to inform her about this  Patricia Dean, Please advise what to do next.

## 2016-04-11 NOTE — Telephone Encounter (Signed)
Pt verbalize understand. Will talk about this when she comes in for follow in 04/2016 with [facility].

## 2016-04-16 ENCOUNTER — Encounter: Payer: Self-pay | Admitting: Podiatry

## 2016-04-16 DIAGNOSIS — M21542 Acquired clubfoot, left foot: Secondary | ICD-10-CM | POA: Diagnosis not present

## 2016-04-16 DIAGNOSIS — M2012 Hallux valgus (acquired), left foot: Secondary | ICD-10-CM | POA: Diagnosis not present

## 2016-04-25 ENCOUNTER — Ambulatory Visit (INDEPENDENT_AMBULATORY_CARE_PROVIDER_SITE_OTHER): Payer: BLUE CROSS/BLUE SHIELD

## 2016-04-25 ENCOUNTER — Ambulatory Visit (INDEPENDENT_AMBULATORY_CARE_PROVIDER_SITE_OTHER): Payer: BLUE CROSS/BLUE SHIELD | Admitting: Podiatry

## 2016-04-25 ENCOUNTER — Encounter: Payer: Self-pay | Admitting: Podiatry

## 2016-04-25 VITALS — Temp 98.9°F

## 2016-04-25 DIAGNOSIS — M21619 Bunion of unspecified foot: Secondary | ICD-10-CM

## 2016-04-25 DIAGNOSIS — Z9889 Other specified postprocedural states: Secondary | ICD-10-CM

## 2016-04-26 NOTE — Progress Notes (Signed)
Subjective:     Patient ID: Patricia Dean, female   DOB: March 28, 1985, 31 y.o.   MRN: [id number]  HPI patient states she's doing really well with minimal discomfort and swelling   Review of Systems     Objective:   Physical Exam Neurovascular status intact negative Homans sign noted with hallux left in rectus position wound edges well coapted on the first and fifth metatarsal and good range of motion    Assessment:     Doing well post forefoot surgery left    Plan:     X-rays taken and at this time I re-compress the foot and advised on continued elevation and continued immobilization compression and will be seen back again in approximate 2 weeks or earlier if needed  X-rays indicate the osteotomies are healing well with good positional component and joint congruence

## 2016-05-02 ENCOUNTER — Encounter: Payer: Self-pay | Admitting: Nurse Practitioner

## 2016-05-02 ENCOUNTER — Ambulatory Visit (INDEPENDENT_AMBULATORY_CARE_PROVIDER_SITE_OTHER): Payer: BLUE CROSS/BLUE SHIELD | Admitting: Nurse Practitioner

## 2016-05-02 DIAGNOSIS — J302 Other seasonal allergic rhinitis: Secondary | ICD-10-CM | POA: Diagnosis not present

## 2016-05-02 DIAGNOSIS — J309 Allergic rhinitis, unspecified: Secondary | ICD-10-CM | POA: Insufficient documentation

## 2016-05-02 DIAGNOSIS — F4323 Adjustment disorder with mixed anxiety and depressed mood: Secondary | ICD-10-CM | POA: Diagnosis not present

## 2016-05-02 MED ORDER — SERTRALINE HCL 50 MG PO TABS: 50.0000 mg | ORAL_TABLET | Freq: Every day | ORAL | 1 refills | Status: AC

## 2016-05-02 MED ORDER — PHENTERMINE HCL 37.5 MG PO TABS: 37.5000 mg | ORAL_TABLET | Freq: Every day | ORAL | 0 refills | Status: DC

## 2016-05-02 MED ORDER — CETIRIZINE HCL 10 MG PO TABS: 10.0000 mg | ORAL_TABLET | Freq: Every day | ORAL | 0 refills | Status: AC

## 2016-05-02 MED ORDER — FLUTICASONE PROPIONATE 50 MCG/ACT NA SUSP: 2.0000 | Freq: Every day | NASAL | 6 refills | Status: AC

## 2016-05-02 MED ORDER — PHENTERMINE HCL 37.5 MG PO TABS: 37.5000 mg | ORAL_TABLET | Freq: Every day | ORAL | 1 refills | Status: DC

## 2016-05-02 NOTE — Progress Notes (Signed)
Subjective:  Patient ID: Patricia Dean, female    DOB: [birth year]  Age: 31 y.o. MRN: [id number]  CC: Follow-up (follow up on weight loss/right ear fluid?)   Otalgia   There is pain in the right ear. This is a new problem. The current episode started in the past 7 days. The problem occurs constantly. The problem has been waxing and waning. There has been no fever. Associated symptoms include rhinorrhea. Pertinent negatives include no abdominal pain, coughing, diarrhea, ear discharge, headaches, hearing loss, neck pain, rash, sore throat or vomiting. Treatments tried: zyrtec. The treatment provided mild relief. There is no history of a chronic ear infection, hearing loss or a tympanostomy tube.   Weight loss: Has not used phentermine due to cost. Has not change diet yet. Has left foot surgery 2week ago and has not been able to exercise.  Outpatient Medications Prior to Visit  Medication Sig Dispense Refill  . Calcium Carbonate-Vit D-Min (CALCIUM 1200 PO) Take by mouth.    . cholecalciferol (VITAMIN D) 1000 units tablet Take 1,000 Units by mouth daily.    . sertraline (ZOLOFT) 50 MG tablet Take 1 tablet (50 mg total) by mouth daily. 30 tablet 3  . phentermine (ADIPEX-P) 37.5 MG tablet Take 1 tablet (37.5 mg total) by mouth daily before breakfast. (Patient not taking: Reported on 05/02/2016) 30 tablet 0   No facility-administered medications prior to visit.     ROS See HPI  Objective:  BP 122/78   Pulse 77   Temp 97.8 F (36.6 C)   Ht 5\' 9"  (1.753 m)   Wt (!) 354 lb (160.6 kg)   SpO2 98%   BMI 52.28 kg/m   BP Readings from Last 3 Encounters:  05/02/16 122/78  03/29/16 122/80  02/23/16 128/70    Wt Readings from Last 3 Encounters:  05/02/16 (!) 354 lb (160.6 kg)  03/29/16 (!) 348 lb (157.9 kg)  03/13/16 (!) 341 lb (154.7 kg)    Physical Exam  Constitutional: She is oriented to person, place, and time. No distress.  HENT:  Right Ear: Tympanic membrane, external ear and  ear canal normal. Tympanic membrane is not injected and not erythematous. No middle ear effusion.  Left Ear: Tympanic membrane, external ear and ear canal normal. Tympanic membrane is not injected and not erythematous.  No middle ear effusion.  Nose: Mucosal edema and rhinorrhea present. Right sinus exhibits no maxillary sinus tenderness and no frontal sinus tenderness. Left sinus exhibits no maxillary sinus tenderness and no frontal sinus tenderness.  Mouth/Throat: Oropharynx is clear and moist. No oropharyngeal exudate.  Eyes: No scleral icterus.  Neck: Normal range of motion. Neck supple.  Cardiovascular: Normal rate, regular rhythm and normal heart sounds.   Pulmonary/Chest: Effort normal and breath sounds normal. No respiratory distress.  Abdominal: Soft. She exhibits no distension.  Musculoskeletal: Normal range of motion. She exhibits no edema.  Lymphadenopathy:    She has no cervical adenopathy.  Neurological: She is alert and oriented to person, place, and time.  Skin: Skin is warm and dry.  Psychiatric: She has a normal mood and affect. Her behavior is normal.    Lab Results  Component Value Date   WBC 8.6 02/23/2016   HGB 13.3 02/23/2016   HCT 38.1 02/23/2016   PLT 275.0 02/23/2016   GLUCOSE 98 02/23/2016   CHOL 209 (H) 02/23/2016   TRIG 92.0 02/23/2016   HDL 46.60 02/23/2016   LDLCALC 144 (H) 02/23/2016   ALT 14 02/23/2016  AST 14 02/23/2016   NA 139 02/23/2016   K 4.2 02/23/2016   CL 104 02/23/2016   CREATININE 0.90 02/23/2016   BUN 17 02/23/2016   CO2 29 02/23/2016   TSH 1.72 02/23/2016    Dg Knee Complete 4 Views Left  Result Date: 02/23/2016 CLINICAL DATA:  Chronic knee pain. EXAM: LEFT KNEE - COMPLETE 4+ VIEW COMPARISON:  No recent prior . FINDINGS: No acute bony or joint abnormality identified. No evidence of fracture or dislocation. IMPRESSION: No acute abnormality. Electronically Signed   By: Marcello Moores  Register   On: 02/23/2016 13:02   Dg Knee Complete 4  Views Right  Result Date: 02/23/2016 CLINICAL DATA:  Fall. EXAM: RIGHT KNEE - COMPLETE 4+ VIEW COMPARISON:  No recent prior . FINDINGS: No acute bony or joint abnormality identified. No evidence of fracture or dislocation. IMPRESSION: No acute abnormality. Electronically Signed   By: Marcello Moores  Register   On: 02/23/2016 12:48    Assessment & Plan:   Yetta was seen today for follow-up.  Diagnoses and all orders for this visit:  Morbid obesity ([place]) -     Discontinue: phentermine (ADIPEX-P) 37.5 MG tablet; Take 1 tablet (37.5 mg total) by mouth daily before breakfast. -     phentermine (ADIPEX-P) 37.5 MG tablet; Take 1 tablet (37.5 mg total) by mouth daily before breakfast.  Adjustment disorder with mixed anxiety and depressed mood -     sertraline (ZOLOFT) 50 MG tablet; Take 1 tablet (50 mg total) by mouth daily.  Chronic seasonal allergic rhinitis, unspecified trigger -     cetirizine (ZYRTEC) 10 MG tablet; Take 1 tablet (10 mg total) by mouth daily. -     fluticasone (FLONASE) 50 MCG/ACT nasal spray; Place 2 sprays into both nostrils daily.   I am having Ms. Kuzniar start on cetirizine and fluticasone. I am also having her maintain her cholecalciferol, Calcium Carbonate-Vit D-Min (CALCIUM 1200 PO), sertraline, and phentermine.  Meds ordered this encounter  Medications  . DISCONTD: phentermine (ADIPEX-P) 37.5 MG tablet    Sig: Take 1 tablet (37.5 mg total) by mouth daily before breakfast.    Dispense:  30 tablet    Refill:  0    Order Specific Question:   Supervising Provider    Answer:   Cassandria Anger [[id number]]  . sertraline (ZOLOFT) 50 MG tablet    Sig: Take 1 tablet (50 mg total) by mouth daily.    Dispense:  90 tablet    Refill:  1    Order Specific Question:   Supervising Provider    Answer:   Cassandria Anger [[id number]]  . cetirizine (ZYRTEC) 10 MG tablet    Sig: Take 1 tablet (10 mg total) by mouth daily.    Dispense:  30 tablet    Refill:  0    Order Specific  Question:   Supervising Provider    Answer:   Cassandria Anger [[id number]]  . phentermine (ADIPEX-P) 37.5 MG tablet    Sig: Take 1 tablet (37.5 mg total) by mouth daily before breakfast.    Dispense:  30 tablet    Refill:  1    Order Specific Question:   Supervising Provider    Answer:   Cassandria Anger [[id number]]  . fluticasone (FLONASE) 50 MCG/ACT nasal spray    Sig: Place 2 sprays into both nostrils daily.    Dispense:  16 g    Refill:  6    Order Specific Question:   Supervising Provider  Answer:   Cassandria Anger [[id number]]    Follow-up: Return in about 2 months (around 07/02/2016) for weight loss, and BP check.Wilfred Lacy, NP

## 2016-05-02 NOTE — Patient Instructions (Addendum)
Provided patient with good rx coupon which will cost $10.76 fpr 30tabs.  Calorie Counting for Weight Loss Calories are units of energy. Your body needs a certain amount of calories from food to keep you going throughout the day. When you eat more calories than your body needs, your body stores the extra calories as fat. When you eat fewer calories than your body needs, your body burns fat to get the energy it needs. Calorie counting means keeping track of how many calories you eat and drink each day. Calorie counting can be helpful if you need to lose weight. If you make sure to eat fewer calories than your body needs, you should lose weight. Ask your health care provider what a healthy weight is for you. For calorie counting to work, you will need to eat the right number of calories in a day in order to lose a healthy amount of weight per week. A dietitian can help you determine how many calories you need in a day and will give you suggestions on how to reach your calorie goal.  A healthy amount of weight to lose per week is usually 1-2 lb (0.5-0.9 kg). This usually means that your daily calorie intake should be reduced by 500-750 calories.  Eating 1,200 - 1,500 calories per day can help most women lose weight.  Eating 1,500 - 1,800 calories per day can help most men lose weight. What is my plan? My goal is to have __________ calories per day. If I have this many calories per day, I should lose around __________ pounds per week. What do I need to know about calorie counting? In order to meet your daily calorie goal, you will need to:  Find out how many calories are in each food you would like to eat. Try to do this before you eat.  Decide how much of the food you plan to eat.  Write down what you ate and how many calories it had. Doing this is called keeping a food log. To successfully lose weight, it is important to balance calorie counting with a healthy lifestyle that includes regular  activity. Aim for 150 minutes of moderate exercise (such as walking) or 75 minutes of vigorous exercise (such as running) each week. Where do I find calorie information?   The number of calories in a food can be found on a Nutrition Facts label. If a food does not have a Nutrition Facts label, try to look up the calories online or ask your dietitian for help. Remember that calories are listed per serving. If you choose to have more than one serving of a food, you will have to multiply the calories per serving by the amount of servings you plan to eat. For example, the label on a package of bread might say that a serving size is 1 slice and that there are 90 calories in a serving. If you eat 1 slice, you will have eaten 90 calories. If you eat 2 slices, you will have eaten 180 calories. How do I keep a food log? Immediately after each meal, record the following information in your food log:  What you ate. Don't forget to include toppings, sauces, and other extras on the food.  How much you ate. This can be measured in cups, ounces, or number of items.  How many calories each food and drink had.  The total number of calories in the meal. Keep your food log near you, such as in a small notebook  in your pocket, or use a mobile app or website. Some programs will calculate calories for you and show you how many calories you have left for the day to meet your goal. What are some calorie counting tips?  Use your calories on foods and drinks that will fill you up and not leave you hungry:  Some examples of foods that fill you up are nuts and nut butters, vegetables, lean proteins, and high-fiber foods like whole grains. High-fiber foods are foods with more than 5 g fiber per serving.  Drinks such as sodas, specialty coffee drinks, alcohol, and juices have a lot of calories, yet do not fill you up.  Eat nutritious foods and avoid empty calories. Empty calories are calories you get from foods or  beverages that do not have many vitamins or protein, such as candy, sweets, and soda. It is better to have a nutritious high-calorie food (such as an avocado) than a food with few nutrients (such as a bag of chips).  Know how many calories are in the foods you eat most often. This will help you calculate calorie counts faster.  Pay attention to calories in drinks. Low-calorie drinks include water and unsweetened drinks.  Pay attention to nutrition labels for "low fat" or "fat free" foods. These foods sometimes have the same amount of calories or more calories than the full fat versions. They also often have added sugar, starch, or salt, to make up for flavor that was removed with the fat.  Find a way of tracking calories that works for you. Get creative. Try different apps or programs if writing down calories does not work for you. What are some portion control tips?  Know how many calories are in a serving. This will help you know how many servings of a certain food you can have.  Use a measuring cup to measure serving sizes. You could also try weighing out portions on a kitchen scale. With time, you will be able to estimate serving sizes for some foods.  Take some time to put servings of different foods on your favorite plates, bowls, and cups so you know what a serving looks like.  Try not to eat straight from a bag or box. Doing this can lead to overeating. Put the amount you would like to eat in a cup or on a plate to make sure you are eating the right portion.  Use smaller plates, glasses, and bowls to prevent overeating.  Try not to multitask (for example, watch TV or use your computer) while eating. If it is time to eat, sit down at a table and enjoy your food. This will help you to know when you are full. It will also help you to be aware of what you are eating and how much you are eating. What are tips for following this plan? Reading food labels   Check the calorie count compared  to the serving size. The serving size may be smaller than what you are used to eating.  Check the source of the calories. Make sure the food you are eating is high in vitamins and protein and low in saturated and trans fats. Shopping   Read nutrition labels while you shop. This will help you make healthy decisions before you decide to purchase your food.  Make a grocery list and stick to it. Cooking   Try to cook your favorite foods in a healthier way. For example, try baking instead of frying.  Use low-fat dairy  products. Meal planning   Use more fruits and vegetables. Half of your plate should be fruits and vegetables.  Include lean proteins like poultry and fish. How do I count calories when eating out?  Ask for smaller portion sizes.  Consider sharing an entree and sides instead of getting your own entree.  If you get your own entree, eat only half. Ask for a box at the beginning of your meal and put the rest of your entree in it so you are not tempted to eat it.  If calories are listed on the menu, choose the lower calorie options.  Choose dishes that include vegetables, fruits, whole grains, low-fat dairy products, and lean protein.  Choose items that are boiled, broiled, grilled, or steamed. Stay away from items that are buttered, battered, fried, or served with cream sauce. Items labeled "crispy" are usually fried, unless stated otherwise.  Choose water, low-fat milk, unsweetened iced tea, or other drinks without added sugar. If you want an alcoholic beverage, choose a lower calorie option such as a glass of wine or light beer.  Ask for dressings, sauces, and syrups on the side. These are usually high in calories, so you should limit the amount you eat.  If you want a salad, choose a garden salad and ask for grilled meats. Avoid extra toppings like bacon, cheese, or fried items. Ask for the dressing on the side, or ask for olive oil and vinegar or lemon to use as  dressing.  Estimate how many servings of a food you are given. For example, a serving of cooked rice is  cup or about the size of half a baseball. Knowing serving sizes will help you be aware of how much food you are eating at restaurants. The list below tells you how big or small some common portion sizes are based on everyday objects:  1 oz-4 stacked dice.  3 oz-1 deck of cards.  1 tsp-1 die.  1 Tbsp- a ping-pong ball.  2 Tbsp-1 ping-pong ball.   cup- baseball.  1 cup-1 baseball. Summary  Calorie counting means keeping track of how many calories you eat and drink each day. If you eat fewer calories than your body needs, you should lose weight.  A healthy amount of weight to lose per week is usually 1-2 lb (0.5-0.9 kg). This usually means reducing your daily calorie intake by 500-750 calories.  The number of calories in a food can be found on a Nutrition Facts label. If a food does not have a Nutrition Facts label, try to look up the calories online or ask your dietitian for help.  Use your calories on foods and drinks that will fill you up, and not on foods and drinks that will leave you hungry.  Use smaller plates, glasses, and bowls to prevent overeating. This information is not intended to replace advice given to you by your health care provider. Make sure you discuss any questions you have with your health care provider. Document Released: 01/14/2005 Document Revised: 12/15/2015 Document Reviewed: 12/15/2015 Elsevier Interactive Patient Education  2017 [employer].

## 2016-05-02 NOTE — Progress Notes (Signed)
Pre visit review using our clinic review tool, if applicable. No additional management support is needed unless otherwise documented below in the visit note. 

## 2016-05-03 ENCOUNTER — Ambulatory Visit: Payer: BLUE CROSS/BLUE SHIELD | Admitting: Nurse Practitioner

## 2016-05-17 ENCOUNTER — Ambulatory Visit (INDEPENDENT_AMBULATORY_CARE_PROVIDER_SITE_OTHER): Payer: BLUE CROSS/BLUE SHIELD | Admitting: Podiatry

## 2016-05-17 ENCOUNTER — Encounter: Payer: Self-pay | Admitting: Podiatry

## 2016-05-17 ENCOUNTER — Ambulatory Visit (INDEPENDENT_AMBULATORY_CARE_PROVIDER_SITE_OTHER): Payer: BLUE CROSS/BLUE SHIELD

## 2016-05-17 VITALS — BP 147/94 | HR 96 | Resp 16

## 2016-05-17 DIAGNOSIS — M21619 Bunion of unspecified foot: Secondary | ICD-10-CM

## 2016-05-17 NOTE — Progress Notes (Signed)
Subjective:     Patient ID: Patricia Dean, female   DOB: Aug 09, 1985, 31 y.o.   MRN: [id number]  HPI patient states I'm doing real well with my left foot and I know I'll need to get the right foot fixed   Review of Systems     Objective:   Physical Exam Neurovascular status intact negative Homans sign noted with well coapted incision sites left first fifth metatarsal good alignment of the hallux and good range of motion    Assessment:     Doing well post osteotomy first metatarsal left metatarsal osteotomy fifth left    Plan:     H&P conditions reviewed and reapplied sterile dressing along with continued elevation compression immobilization and ibuprofen as needed. Patient be seen back 3 weeks or earlier if needed we will discuss her right foot  X-rays indicate pins are in place with good alignment and joint congruence and fifth metatarsal is in good position

## 2016-06-07 ENCOUNTER — Ambulatory Visit (INDEPENDENT_AMBULATORY_CARE_PROVIDER_SITE_OTHER): Payer: BLUE CROSS/BLUE SHIELD | Admitting: Podiatry

## 2016-06-07 ENCOUNTER — Ambulatory Visit (INDEPENDENT_AMBULATORY_CARE_PROVIDER_SITE_OTHER): Payer: BLUE CROSS/BLUE SHIELD

## 2016-06-07 DIAGNOSIS — Z9889 Other specified postprocedural states: Secondary | ICD-10-CM

## 2016-06-07 DIAGNOSIS — M21619 Bunion of unspecified foot: Secondary | ICD-10-CM

## 2016-06-07 DIAGNOSIS — M722 Plantar fascial fibromatosis: Secondary | ICD-10-CM

## 2016-06-09 NOTE — Progress Notes (Signed)
Subjective:    Patient ID: Patricia Dean, female   DOB: 31 y.o.   MRN: [id number]   HPI patient states she's doing well and like to get her right one fixed later in the year. States the pain is minimal and she's able to ambulate without significant problems    ROS      Objective:  Physical Exam Neurovascular status intact negative Homans sign noted with well coapted incision sites first fifth metatarsal left with excellent range of motion    Assessment:    Doing well post osteotomy first fifth metatarsal left     Plan:    Explained continued recovery reviewed x-rays and allow patient to return to normal activities  X-rays indicate osteotomy is healing well pins screw in place with good reduction of deformity

## 2016-06-11 ENCOUNTER — Ambulatory Visit (INDEPENDENT_AMBULATORY_CARE_PROVIDER_SITE_OTHER): Payer: BLUE CROSS/BLUE SHIELD | Admitting: Nurse Practitioner

## 2016-06-11 ENCOUNTER — Ambulatory Visit (INDEPENDENT_AMBULATORY_CARE_PROVIDER_SITE_OTHER)
Admission: RE | Admit: 2016-06-11 | Discharge: 2016-06-11 | Disposition: A | Discharge status: Home / Self Care | Payer: BLUE CROSS/BLUE SHIELD | Source: Ambulatory Visit | Attending: Nurse Practitioner | Admitting: Nurse Practitioner

## 2016-06-11 ENCOUNTER — Encounter: Payer: Self-pay | Admitting: Nurse Practitioner

## 2016-06-11 VITALS — BP 140/84 | HR 88 | Temp 97.7°F | Ht 69.0 in | Wt 339.0 lb

## 2016-06-11 DIAGNOSIS — R519 Headache, unspecified: Secondary | ICD-10-CM

## 2016-06-11 DIAGNOSIS — R0981 Nasal congestion: Secondary | ICD-10-CM

## 2016-06-11 DIAGNOSIS — R42 Dizziness and giddiness: Secondary | ICD-10-CM

## 2016-06-11 DIAGNOSIS — H9203 Otalgia, bilateral: Secondary | ICD-10-CM | POA: Diagnosis not present

## 2016-06-11 DIAGNOSIS — H9313 Tinnitus, bilateral: Secondary | ICD-10-CM | POA: Diagnosis not present

## 2016-06-11 DIAGNOSIS — R51 Headache: Secondary | ICD-10-CM

## 2016-06-11 NOTE — Progress Notes (Signed)
Subjective:  Patient ID: Patricia Dean, female    DOB: 11/29/1985  Age: 31 y.o. MRN: [id number]  CC: Ear Pain (ears pain,headache,dizzy,"static sound")   Headache   This is a new problem. The current episode started more than 1 month ago. The problem occurs constantly. The problem has been waxing and waning. The pain is located in the vertex and frontal region. The pain does not radiate. The pain quality is not similar to prior headaches. The quality of the pain is described as aching and dull. Associated symptoms include dizziness and sinus pressure. Pertinent negatives include no abdominal pain, abnormal behavior, anorexia, back pain, blurred vision, coughing, drainage, ear pain, eye pain, eye redness, eye watering, fever, hearing loss, insomnia, loss of balance, muscle aches, nausea, neck pain, numbness, phonophobia, photophobia, scalp tenderness, seizures, sore throat, swollen glands, tingling, tinnitus, visual change, vomiting or weakness. Nothing aggravates the symptoms. She has tried acetaminophen and NSAIDs for the symptoms. The treatment provided no relief. Her past medical history is significant for obesity. There is no history of hypertension, migraine headaches, sinus disease or TMJ.  also tried flonase and anthistamine with no improvement.  Outpatient Medications Prior to Visit  Medication Sig Dispense Refill  . Calcium Carbonate-Vit D-Min (CALCIUM 1200 PO) Take by mouth.    . cetirizine (ZYRTEC) 10 MG tablet Take 1 tablet (10 mg total) by mouth daily. 30 tablet 0  . cholecalciferol (VITAMIN D) 1000 units tablet Take 1,000 Units by mouth daily.    . fluticasone (FLONASE) 50 MCG/ACT nasal spray Place 2 sprays into both nostrils daily. 16 g 6  . glucosamine-chondroitin 500-400 MG tablet Take 1 tablet by mouth daily.    . phentermine (ADIPEX-P) 37.5 MG tablet Take 1 tablet (37.5 mg total) by mouth daily before breakfast. 30 tablet 1  . sertraline (ZOLOFT) 50 MG tablet Take 1 tablet (50  mg total) by mouth daily. 90 tablet 1   No facility-administered medications prior to visit.     ROS See HPI  Objective:  BP 140/84   Pulse 88   Temp 97.7 F (36.5 C)   Ht 5\' 9"  (1.753 m)   Wt (!) 339 lb (153.8 kg)   SpO2 99%   BMI 50.06 kg/m   BP Readings from Last 3 Encounters:  06/11/16 140/84  05/17/16 (!) 147/94  05/02/16 122/78    Wt Readings from Last 3 Encounters:  06/11/16 (!) 339 lb (153.8 kg)  05/02/16 (!) 354 lb (160.6 kg)  03/29/16 (!) 348 lb (157.9 kg)    Physical Exam  Constitutional: She is oriented to person, place, and time. No distress.  HENT:  Right Ear: Tympanic membrane, external ear and ear canal normal. No mastoid tenderness. Tympanic membrane is not injected and not erythematous. No middle ear effusion.  Left Ear: Tympanic membrane, external ear and ear canal normal. No mastoid tenderness. Tympanic membrane is not injected and not erythematous.  No middle ear effusion.  Nose: No mucosal edema or rhinorrhea. Right sinus exhibits no maxillary sinus tenderness and no frontal sinus tenderness. Left sinus exhibits no maxillary sinus tenderness and no frontal sinus tenderness.  Mouth/Throat: Uvula is midline. No trismus in the jaw. No oropharyngeal exudate or posterior oropharyngeal erythema.  Eyes: No scleral icterus.  Neck: Normal range of motion. Neck supple.  Cardiovascular: Normal rate and normal heart sounds.   Pulmonary/Chest: Effort normal and breath sounds normal.  Musculoskeletal: She exhibits no edema or tenderness.  Lymphadenopathy:    She has no cervical adenopathy.  Neurological: She is alert and oriented to person, place, and time.  Skin: Skin is warm and dry.  Vitals reviewed.   Lab Results  Component Value Date   WBC 8.6 02/23/2016   HGB 13.3 02/23/2016   HCT 38.1 02/23/2016   PLT 275.0 02/23/2016   GLUCOSE 98 02/23/2016   CHOL 209 (H) 02/23/2016   TRIG 92.0 02/23/2016   HDL 46.60 02/23/2016   LDLCALC 144 (H) 02/23/2016     ALT 14 02/23/2016   AST 14 02/23/2016   NA 139 02/23/2016   K 4.2 02/23/2016   CL 104 02/23/2016   CREATININE 0.90 02/23/2016   BUN 17 02/23/2016   CO2 29 02/23/2016   TSH 1.72 02/23/2016    Dg Knee Complete 4 Views Left  Result Date: 02/23/2016 CLINICAL DATA:  Chronic knee pain. EXAM: LEFT KNEE - COMPLETE 4+ VIEW COMPARISON:  No recent prior . FINDINGS: No acute bony or joint abnormality identified. No evidence of fracture or dislocation. IMPRESSION: No acute abnormality. Electronically Signed   By: Marcello Moores  Register   On: 02/23/2016 13:02   Dg Knee Complete 4 Views Right  Result Date: 02/23/2016 CLINICAL DATA:  Fall. EXAM: RIGHT KNEE - COMPLETE 4+ VIEW COMPARISON:  No recent prior . FINDINGS: No acute bony or joint abnormality identified. No evidence of fracture or dislocation. IMPRESSION: No acute abnormality. Electronically Signed   By: Marcello Moores  Register   On: 02/23/2016 12:48    Assessment & Plan:   Shalamar was seen today for ear pain.  Diagnoses and all orders for this visit:  Otalgia of both ears -     Ambulatory referral to ENT -     CT MAXILLOFACIAL WO CONTRAST; Future  Sinus congestion -     Ambulatory referral to ENT -     CT MAXILLOFACIAL WO CONTRAST; Future  Tinnitus of both ears -     Ambulatory referral to ENT  Nonintractable episodic headache, unspecified headache type -     CT MAXILLOFACIAL WO CONTRAST; Future  Light-headed feeling -     CT MAXILLOFACIAL WO CONTRAST; Future   I am having Patricia Dean maintain her cholecalciferol, Calcium Carbonate-Vit D-Min (CALCIUM 1200 PO), sertraline, cetirizine, phentermine, fluticasone, and glucosamine-chondroitin.  No orders of the defined types were placed in this encounter.   Follow-up: Return if symptoms worsen or fail to improve.  Wilfred Lacy, NP

## 2016-06-11 NOTE — Patient Instructions (Addendum)
You will be contacted with appt for CT sinus and ENT referral.  CT sinus to rule out sinus disease. Consider holding phentermine with CT sinus normal.

## 2016-06-28 ENCOUNTER — Ambulatory Visit: Payer: BLUE CROSS/BLUE SHIELD

## 2016-06-28 DIAGNOSIS — M722 Plantar fascial fibromatosis: Secondary | ICD-10-CM

## 2016-06-28 NOTE — Progress Notes (Signed)
Orthotics sent back to for increase in arch support and lateral heel support. Patient will need to be called when orthotics arrive for pick up appt

## 2016-06-28 NOTE — Patient Instructions (Signed)

## 2016-07-04 NOTE — Progress Notes (Signed)
1. Austin bunionectomy (cutting and moving bone) with pin left 2. 5th metatarsal osteotomy with screw 5th left

## 2016-07-05 ENCOUNTER — Ambulatory Visit (INDEPENDENT_AMBULATORY_CARE_PROVIDER_SITE_OTHER): Payer: BLUE CROSS/BLUE SHIELD | Admitting: Nurse Practitioner

## 2016-07-05 ENCOUNTER — Encounter: Payer: Self-pay | Admitting: Nurse Practitioner

## 2016-07-05 VITALS — BP 126/80 | HR 85 | Temp 98.3°F | Ht 69.0 in | Wt 338.0 lb

## 2016-07-05 DIAGNOSIS — F4323 Adjustment disorder with mixed anxiety and depressed mood: Secondary | ICD-10-CM

## 2016-07-05 DIAGNOSIS — Z6841 Body Mass Index (BMI) 40.0 and over, adult: Secondary | ICD-10-CM

## 2016-07-05 MED ORDER — PHENTERMINE HCL 37.5 MG PO TABS: 37.5000 mg | ORAL_TABLET | Freq: Every day | ORAL | 2 refills | Status: AC

## 2016-07-05 NOTE — Progress Notes (Signed)
Subjective:  Patient ID: Patricia Dean, female    DOB: 08-May-1985  Age: 31 y.o. MRN: [id number]  CC: Follow-up (2 mo fu/lossing weight but not alot--)  HPI  Return to weight loss management. Denies any adverse side effects with phentermine. She continues to maintain healthy diet, portion control and exercise.  Anxiety and Depression: Mood is stable with zoloft.  Outpatient Medications Prior to Visit  Medication Sig Dispense Refill  . Calcium Carbonate-Vit D-Min (CALCIUM 1200 PO) Take by mouth.    . cetirizine (ZYRTEC) 10 MG tablet Take 1 tablet (10 mg total) by mouth daily. 30 tablet 0  . cholecalciferol (VITAMIN D) 1000 units tablet Take 1,000 Units by mouth daily.    . fluticasone (FLONASE) 50 MCG/ACT nasal spray Place 2 sprays into both nostrils daily. 16 g 6  . glucosamine-chondroitin 500-400 MG tablet Take 1 tablet by mouth daily.    . meperidine (DEMEROL) 50 MG tablet Take 50 mg by mouth every 4 (four) hours as needed for severe pain.    . promethazine (PHENERGAN) 25 MG tablet Take 25 mg by mouth every 6 (six) hours as needed for nausea or vomiting.    . sertraline (ZOLOFT) 50 MG tablet Take 1 tablet (50 mg total) by mouth daily. 90 tablet 1  . phentermine (ADIPEX-P) 37.5 MG tablet Take 1 tablet (37.5 mg total) by mouth daily before breakfast. 30 tablet 1   No facility-administered medications prior to visit.     ROS See HPI  Objective:  BP 126/80 (BP Location: Left Arm, Patient Position: Sitting, Cuff Size: Large)   Pulse 85   Temp 98.3 F (36.8 C)   Ht 5\' 9"  (1.753 m)   Wt (!) 338 lb (153.3 kg)   SpO2 98%   BMI 49.91 kg/m   BP Readings from Last 3 Encounters:  07/05/16 126/80  06/11/16 140/84  05/17/16 (!) 147/94    Wt Readings from Last 3 Encounters:  07/05/16 (!) 338 lb (153.3 kg)  06/11/16 (!) 339 lb (153.8 kg)  05/02/16 (!) 354 lb (160.6 kg)    Physical Exam  Constitutional: She is oriented to person, place, and time.  Neck: Normal range of  motion. Neck supple. No thyromegaly present.  Cardiovascular: Normal rate, regular rhythm and normal heart sounds.   Pulmonary/Chest: Effort normal.  Abdominal: Soft.  Musculoskeletal: Normal range of motion. She exhibits no edema.  Neurological: She is alert and oriented to person, place, and time.  Skin: Skin is warm and dry.  Vitals reviewed.   Lab Results  Component Value Date   WBC 8.6 02/23/2016   HGB 13.3 02/23/2016   HCT 38.1 02/23/2016   PLT 275.0 02/23/2016   GLUCOSE 98 02/23/2016   CHOL 209 (H) 02/23/2016   TRIG 92.0 02/23/2016   HDL 46.60 02/23/2016   LDLCALC 144 (H) 02/23/2016   ALT 14 02/23/2016   AST 14 02/23/2016   NA 139 02/23/2016   K 4.2 02/23/2016   CL 104 02/23/2016   CREATININE 0.90 02/23/2016   BUN 17 02/23/2016   CO2 29 02/23/2016   TSH 1.72 02/23/2016    Ct Maxillofacial Ltd Wo Cm  Result Date: 06/11/2016 CLINICAL DATA:  Two months of bilateral ear pressure and tinnitus. Assess for sinusitis. EXAM: CT PARANASAL SINUS LIMITED WITHOUT CONTRAST TECHNIQUE: Non-contiguous multidetector CT images of the paranasal sinuses were obtained in a single plane without contrast. COMPARISON:  None. FINDINGS: SINUSES: Maxillary, ethmoid, sphenoid and frontal sinuses are well aerated. No abnormal antral expansion or  atresia, bony wall thickening/mucoperiosteal reaction. Nasal septum is slightly deviated to the LEFT. FACIAL BONES: No acute facial fracture. No destructive bony lesions. ORBITS: Ocular globes and orbital contents are non-suspicious by low-dose protocol. IMPRESSION: Negative limited CT paranasal sinus. Electronically Signed   By: Elon Alas M.D.   On: 06/11/2016 16:26    Assessment & Plan:   Tamey was seen today for follow-up.  Diagnoses and all orders for this visit:  Adjustment disorder with mixed anxiety and depressed mood  Morbid obesity (HCC) -     phentermine (ADIPEX-P) 37.5 MG tablet; Take 1 tablet (37.5 mg total) by mouth daily before  breakfast.   I am having Ms. Geter maintain her cholecalciferol, Calcium Carbonate-Vit D-Min (CALCIUM 1200 PO), sertraline, cetirizine, fluticasone, glucosamine-chondroitin, meperidine, promethazine, and phentermine.  Meds ordered this encounter  Medications  . phentermine (ADIPEX-P) 37.5 MG tablet    Sig: Take 1 tablet (37.5 mg total) by mouth daily before breakfast.    Dispense:  30 tablet    Refill:  2    Order Specific Question:   Supervising Provider    Answer:   Cassandria Anger [[id number]]    Follow-up: Return in about 3 months (around 10/05/2016).  Wilfred Lacy, NP

## 2016-07-05 NOTE — Assessment & Plan Note (Signed)
Will need to discontinue phentermine and/or switch to another appetite suppressant if not at target weight in 62months.  Current use of phentermine since 03/29/2016.  Consider referral to bariatric clinic.

## 2016-07-15 ENCOUNTER — Telehealth: Payer: Self-pay | Admitting: *Deleted

## 2016-07-15 NOTE — Telephone Encounter (Signed)
Left message to call and schedule an appointment to be fitted with adjusted orthotics

## 2016-07-16 ENCOUNTER — Ambulatory Visit: Payer: BLUE CROSS/BLUE SHIELD | Admitting: Podiatry

## 2016-07-16 DIAGNOSIS — M722 Plantar fascial fibromatosis: Secondary | ICD-10-CM

## 2016-07-16 NOTE — Patient Instructions (Signed)

## 2016-07-19 ENCOUNTER — Encounter: Payer: Self-pay | Admitting: Nurse Practitioner

## 2016-07-19 DIAGNOSIS — G8929 Other chronic pain: Secondary | ICD-10-CM | POA: Insufficient documentation

## 2016-07-19 DIAGNOSIS — H9201 Otalgia, right ear: Secondary | ICD-10-CM

## 2016-08-08 ENCOUNTER — Ambulatory Visit (INDEPENDENT_AMBULATORY_CARE_PROVIDER_SITE_OTHER): Payer: BLUE CROSS/BLUE SHIELD | Admitting: Podiatry

## 2016-08-08 ENCOUNTER — Encounter: Payer: Self-pay | Admitting: Podiatry

## 2016-08-08 ENCOUNTER — Ambulatory Visit (INDEPENDENT_AMBULATORY_CARE_PROVIDER_SITE_OTHER): Payer: BLUE CROSS/BLUE SHIELD

## 2016-08-08 VITALS — BP 121/86 | HR 82 | Resp 16

## 2016-08-08 DIAGNOSIS — M779 Enthesopathy, unspecified: Secondary | ICD-10-CM

## 2016-08-08 DIAGNOSIS — M21619 Bunion of unspecified foot: Secondary | ICD-10-CM

## 2016-08-08 MED ORDER — TRIAMCINOLONE ACETONIDE 10 MG/ML IJ SUSP
10.0000 mg | Freq: Once | INTRAMUSCULAR | Status: AC
  Administered 2016-08-08: 10 mg

## 2016-08-08 NOTE — Progress Notes (Signed)
Subjective:    Patient ID: Patricia Dean, female   DOB: 31 y.o.   MRN: [id number]   HPI patient states that overall the surgery done real well but she's having some discomfort in her forefoot left and it's really been more noticeable in the last few weeks. She is wearing tennis shoes at this time    ROS      Objective:  Physical Exam neurovascular status intact with patient noted to have inflammation mostly located around the second MPJ left slightly in the third and fourth with orthotics that do not help reduce weight on the forefoot     Assessment:     Surgical sites left doing well with excellent correction of underlying deformity with probability for capsulitis second MPJ    Plan:    H&P condition reviewed view did a forefoot block of the left and I then aspirated the joint getting a small amount of clear fluid and injected with quarter cc dexamethasone Kenalog and applied thick padding to reduce pressure against the joint surface. If this improves we'll try to modify the orthotics to increase the reduction of pressure and also to offload the lesser metatarsal  X-rays indicate the osteotomy is healed excellent with pins screws in place and good alignment noted with no signs of arthritis

## 2016-08-14 NOTE — Progress Notes (Signed)
Patient presents for orthotic pick up.  Verbal and written break in and wear instructions given.  Patient will follow up in 4 weeks if symptoms worsen or fail to improve. 

## 2016-08-16 ENCOUNTER — Ambulatory Visit (INDEPENDENT_AMBULATORY_CARE_PROVIDER_SITE_OTHER): Payer: BLUE CROSS/BLUE SHIELD | Admitting: Pulmonary Disease

## 2016-08-16 ENCOUNTER — Encounter: Payer: Self-pay | Admitting: Pulmonary Disease

## 2016-08-16 VITALS — BP 104/80 | HR 83 | Ht 69.0 in | Wt 338.0 lb

## 2016-08-16 DIAGNOSIS — Z6841 Body Mass Index (BMI) 40.0 and over, adult: Secondary | ICD-10-CM | POA: Diagnosis not present

## 2016-08-16 DIAGNOSIS — R29818 Other symptoms and signs involving the nervous system: Secondary | ICD-10-CM

## 2016-08-16 NOTE — Progress Notes (Signed)
   Subjective:    Patient ID: Patricia Dean, female    DOB: [birth year], 31 y.o.   MRN: [id number]  HPI    Review of Systems  Constitutional: Negative for fever and unexpected weight change.  HENT: Negative for congestion, dental problem, ear pain, nosebleeds, postnasal drip, rhinorrhea, sinus pressure, sneezing, sore throat and trouble swallowing.   Eyes: Negative for redness and itching.  Respiratory: Positive for shortness of breath. Negative for cough, chest tightness and wheezing.   Cardiovascular: Negative for palpitations and leg swelling.  Gastrointestinal: Negative for nausea and vomiting.  Genitourinary: Negative for dysuria.  Musculoskeletal: Negative for joint swelling.  Skin: Negative for rash.  Neurological: Negative for headaches.  Hematological: Does not bruise/bleed easily.  Psychiatric/Behavioral: Positive for dysphoric mood. The patient is nervous/anxious.        Objective:   Physical Exam        Assessment & Plan:

## 2016-08-16 NOTE — Patient Instructions (Signed)
Will arrange for home sleep study Will call to arrange for follow up after sleep study reviewed  

## 2016-08-16 NOTE — Progress Notes (Signed)
Past Surgical History She  has a past surgical history that includes Nephrectomy (Left, 2011); Cholecystectomy (06/16/2013); Knee surgery (Right, 2015); and Wisdom tooth extraction (Bilateral, 2005).  Allergies  Allergen Reactions  . Percocet [Oxycodone-Acetaminophen] Itching    Family History Patricia family history includes Alcoholism in Patricia Dean; Asperger's syndrome in Patricia Dean and paternal Dean; Autism in Patricia Dean; Clotting disorder (age of onset: 59) in Patricia Dean; Diabetes in Patricia Dean, maternal Dean, Dean, and paternal Dean; Endometrial cancer in Patricia Dean; Heart attack in Patricia Dean; Heart attack (age of onset: 4) in Patricia Dean; Heart failure in Patricia Dean, maternal Dean, paternal Dean, and paternal Dean; Hypertension in Patricia Dean, maternal Dean, paternal Dean, and paternal Dean; Osteoarthritis in Patricia Dean and paternal Dean; Thyroid disease in Patricia Dean.  Social History She  reports that she has never smoked. She has never used smokeless tobacco. She reports that she does not drink alcohol or use drugs.  Review of systems Constitutional: Negative for fever and unexpected weight change.  HENT: Negative for congestion, dental problem, ear pain, nosebleeds, postnasal drip, rhinorrhea, sinus pressure, sneezing, sore throat and trouble swallowing.   Eyes: Negative for redness and itching.  Respiratory: Positive for shortness of breath. Negative for cough, chest tightness and wheezing.   Cardiovascular: Negative for palpitations and leg swelling.  Gastrointestinal: Negative for nausea and vomiting.  Genitourinary: Negative for dysuria.  Musculoskeletal: Negative for joint swelling.  Skin: Negative for rash.  Neurological: Negative for headaches.  Hematological: Does not bruise/bleed easily.  Psychiatric/Behavioral: Positive for dysphoric  mood. The patient is nervous/anxious.     Current Outpatient Prescriptions on File Prior to Visit  Medication Sig  . Calcium Carbonate-Vit D-Min (CALCIUM 1200 PO) Take by mouth.  . cetirizine (ZYRTEC) 10 MG tablet Take 1 tablet (10 mg total) by mouth daily.  . cholecalciferol (VITAMIN D) 1000 units tablet Take 1,000 Units by mouth daily.  . fluticasone (FLONASE) 50 MCG/ACT nasal spray Place 2 sprays into both nostrils daily.  Marland Kitchen glucosamine-chondroitin 500-400 MG tablet Take 1 tablet by mouth daily.  . phentermine (ADIPEX-P) 37.5 MG tablet Take 1 tablet (37.5 mg total) by mouth daily before breakfast. (Patient taking differently: Take 18.75 mg by mouth daily before breakfast. )  . sertraline (ZOLOFT) 50 MG tablet Take 1 tablet (50 mg total) by mouth daily.   No current facility-administered medications on file prior to visit.     Chief Complaint  Patient presents with  . SLEEP CONSULT    Self referral. Epworth Score: 11   Past medical history She  has a past medical history of Anxiety; Cancer of kidney ([place]) (09/2008); Depression; and History of chicken pox (1985).  Vital signs BP 104/80 (BP Location: Left Wrist, Cuff Size: Normal)   Pulse 83   Ht 5\' 9"  (1.753 m)   Wt (!) 338 lb (153.3 kg)   SpO2 95%   BMI 49.91 kg/m   History of Present Illness Patricia Dean is a 31 y.o. female for evaluation of sleep problems.  She has been working with a nutritionist to assist with weight loss.  During this interview there was concern about Patricia sleep.  She has noticed more trouble with Patricia sleep for the past 6 months.  She snores, and will wake up feeling like Patricia throat is closed.  She has trouble sleeping on Patricia back and doesn't dream much.  Patricia Dean was also recently diagnosed with sleep apnea.  She gets tired  during the day, and can fall asleep easily if she is sitting quietly.  She goes to sleep between 1030 pm and 12 am.  She falls asleep after 20 minutes.  She wakes up some times to  use the bathroom.  She gets out of bed at 8 am.  She feels tired in the morning.  She denies morning headache.  She does not use anything to help Patricia fall sleep or stay awake.  She denies sleep walking, sleep talking, bruxism, or nightmares.  There is no history of restless legs.  She denies sleep hallucinations, sleep paralysis, or cataplexy.  The Epworth score is 11 out of 24.   Physical Exam:  General - No distress ENT - No sinus tenderness, no oral exudate, no LAN, no thyromegaly, TM clear, pupils equal/reactive Cardiac - s1s2 regular, no murmur, pulses symmetric Chest - No wheeze/rales/dullness, good air entry, normal respiratory excursion Back - No focal tenderness Abd - Soft, non-tender, no organomegaly, + bowel sounds Ext - No edema Neuro - Normal strength, cranial nerves intact Skin - No rashes Psych - Normal mood, and behavior  Discussion: She has snoring, sleep disruption, daytime sleepiness, and apnea.  She has hx of mood disorder, and family hx of sleep apnea.  I am concerned she could also have sleep apnea.  We discussed how sleep apnea can affect various health problems, including risks for hypertension, cardiovascular disease, and diabetes.  We also discussed how sleep disruption can increase risks for accidents, such as while driving.  Weight loss as a means of improving sleep apnea was also reviewed.  Additional treatment options discussed were CPAP therapy, oral appliance, and surgical intervention.   Assessment/plan:  Suspected sleep apnea. - will arrange for home sleep study  Obesity. - encouraged Patricia to keep up with Patricia weight loss efforts   Patient Instructions  Will arrange for home sleep study Will call to arrange for follow up after sleep study reviewed     Chesley Mires, M.D. Pager [phone] 08/16/2016, 2:59 PM

## 2016-08-28 DIAGNOSIS — G4733 Obstructive sleep apnea (adult) (pediatric): Secondary | ICD-10-CM | POA: Diagnosis not present

## 2016-08-29 ENCOUNTER — Telehealth: Payer: Self-pay | Admitting: Pulmonary Disease

## 2016-08-29 DIAGNOSIS — G4733 Obstructive sleep apnea (adult) (pediatric): Secondary | ICD-10-CM | POA: Insufficient documentation

## 2016-08-29 NOTE — Telephone Encounter (Signed)
HST 08/28/16 >> AHI 12.8, SaO2 low 89%.   Will have my nurse inform pt that sleep study shows mild sleep apnea.  Options are 1) CPAP now, 2) ROV first.  If She is agreeable to CPAP, then please send order for auto CPAP range 5 to 15 cm H2O with heated humidity and mask of choice.  Have download sent 1 month after starting CPAP and set up ROV 2 months after starting CPAP.  ROV can be with me or NP.

## 2016-08-30 ENCOUNTER — Encounter: Payer: Self-pay | Admitting: Podiatry

## 2016-08-30 ENCOUNTER — Ambulatory Visit (INDEPENDENT_AMBULATORY_CARE_PROVIDER_SITE_OTHER): Payer: BLUE CROSS/BLUE SHIELD

## 2016-08-30 ENCOUNTER — Ambulatory Visit (INDEPENDENT_AMBULATORY_CARE_PROVIDER_SITE_OTHER): Payer: BLUE CROSS/BLUE SHIELD | Admitting: Podiatry

## 2016-08-30 VITALS — BP 129/82 | HR 68 | Resp 16

## 2016-08-30 DIAGNOSIS — M779 Enthesopathy, unspecified: Secondary | ICD-10-CM

## 2016-08-30 DIAGNOSIS — M21619 Bunion of unspecified foot: Secondary | ICD-10-CM

## 2016-08-30 NOTE — Progress Notes (Signed)
Subjective:    Patient ID: Patricia Dean, female   DOB: 31 y.o.   MRN: [id number]   HPI patient states she's doing well but does have trouble with her tendinitis and orthotics are sliding    ROS      Objective:  Physical Exam neurovascular status intact with excellent healing of osteotomies first and fifth metatarsal left with good alignment noted and hallux in rectus position with wound edges well coapted. Patient's found to have moderate depression of the arch and does have structural deformity right foot     Assessment:    Doing well post correction left foot with moderate structural changes right with bunion that we'll need to be fixed along with tendinitis symptoms     Plan:    H&P conditions reviewed and I've recommended at this time orthotic modifications with probable lifting up of the lateral side and patient will see Liliane Channel for adjustments. We discussed correction of the right foot which most likely be done by the end of the year and at this time I reviewed x-rays for left and I'm discharging her from this procedure  X-ray indicates he osteotomies are healing well pins and screws in place with good alignment

## 2016-09-02 ENCOUNTER — Other Ambulatory Visit: Payer: Self-pay | Admitting: *Deleted

## 2016-09-02 DIAGNOSIS — G4733 Obstructive sleep apnea (adult) (pediatric): Secondary | ICD-10-CM | POA: Diagnosis not present

## 2016-09-02 DIAGNOSIS — R29818 Other symptoms and signs involving the nervous system: Secondary | ICD-10-CM

## 2016-09-06 NOTE — Telephone Encounter (Signed)
Left message for patient to call back  

## 2016-09-06 NOTE — Telephone Encounter (Signed)
Spoke with patient. She is aware of VS' recs. Wishes to proceed with CPAP. Will place order today.

## 2016-09-06 NOTE — Telephone Encounter (Signed)
Patient is returning phone cal regarding results.

## 2016-09-09 ENCOUNTER — Ambulatory Visit: Payer: BLUE CROSS/BLUE SHIELD | Admitting: Orthotics

## 2016-09-09 DIAGNOSIS — M779 Enthesopathy, unspecified: Secondary | ICD-10-CM

## 2016-09-09 NOTE — Progress Notes (Signed)
Patient came in for F/O adjustments.  Added 1/16" EVA to lateral FF (FF Valgus).

## 2016-09-18 ENCOUNTER — Telehealth: Payer: Self-pay | Admitting: Nurse Practitioner

## 2016-09-18 NOTE — Telephone Encounter (Signed)
LM informing pt.

## 2016-09-18 NOTE — Telephone Encounter (Signed)
Patient is seeing a dietitian. She is requesting she have her PCP ruin some labs. Patient is wanting them to be put in before her up coming appointment. She wants to have the results and be able to talk about them at the appointment.   A1C  And check for PCOS  Please advise. Thank you.

## 2016-09-18 NOTE — Telephone Encounter (Signed)
Please help call pt, charlotte wants to see her and talk to her at the appt first before she order any lab.

## 2016-10-11 ENCOUNTER — Ambulatory Visit: Payer: BLUE CROSS/BLUE SHIELD | Admitting: Nurse Practitioner

## 2016-11-11 ENCOUNTER — Ambulatory Visit: Payer: BLUE CROSS/BLUE SHIELD | Attending: Hematology/Oncology | Admitting: Medical Oncology

## 2016-11-11 DIAGNOSIS — C642 Malignant neoplasm of left kidney, except renal pelvis: Secondary | ICD-10-CM | POA: Insufficient documentation

## 2016-11-11 DIAGNOSIS — Z8049 Family history of malignant neoplasm of other genital organs: Secondary | ICD-10-CM | POA: Insufficient documentation

## 2016-11-11 DIAGNOSIS — K8020 Calculus of gallbladder without cholecystitis without obstruction: Secondary | ICD-10-CM | POA: Insufficient documentation

## 2016-11-11 DIAGNOSIS — Z905 Acquired absence of kidney: Secondary | ICD-10-CM | POA: Insufficient documentation

## 2016-11-11 DIAGNOSIS — M1990 Unspecified osteoarthritis, unspecified site: Secondary | ICD-10-CM | POA: Insufficient documentation

## 2016-11-11 DIAGNOSIS — C779 Secondary and unspecified malignant neoplasm of lymph node, unspecified: Secondary | ICD-10-CM

## 2016-11-11 DIAGNOSIS — G4733 Obstructive sleep apnea (adult) (pediatric): Secondary | ICD-10-CM | POA: Insufficient documentation

## 2016-11-21 ENCOUNTER — Ambulatory Visit: Payer: No Typology Code available for payment source | Admitting: MS"

## 2016-12-06 ENCOUNTER — Ambulatory Visit (HOSPITAL_BASED_OUTPATIENT_CLINIC_OR_DEPARTMENT_OTHER): Payer: No Typology Code available for payment source

## 2016-12-06 ENCOUNTER — Other Ambulatory Visit: Payer: Self-pay | Admitting: Student in an Organized Health Care Education/Training Program

## 2016-12-06 ENCOUNTER — Ambulatory Visit: Payer: No Typology Code available for payment source | Attending: Medical Oncology

## 2016-12-06 DIAGNOSIS — C649 Malignant neoplasm of unspecified kidney, except renal pelvis: Secondary | ICD-10-CM | POA: Insufficient documentation

## 2016-12-10 ENCOUNTER — Telehealth (HOSPITAL_BASED_OUTPATIENT_CLINIC_OR_DEPARTMENT_OTHER): Payer: No Typology Code available for payment source

## 2016-12-27 LAB — BROCA CANCER RISK PANEL: BROCA Result: NEGATIVE

## 2017-11-11 ENCOUNTER — Encounter (HOSPITAL_BASED_OUTPATIENT_CLINIC_OR_DEPARTMENT_OTHER): Payer: No Typology Code available for payment source | Admitting: Medical Oncology

## 2017-11-18 ENCOUNTER — Other Ambulatory Visit: Payer: Self-pay | Admitting: Medical Oncology

## 2017-11-18 ENCOUNTER — Encounter (HOSPITAL_BASED_OUTPATIENT_CLINIC_OR_DEPARTMENT_OTHER): Payer: Self-pay | Admitting: Medical Oncology

## 2017-11-18 ENCOUNTER — Ambulatory Visit (HOSPITAL_BASED_OUTPATIENT_CLINIC_OR_DEPARTMENT_OTHER): Payer: No Typology Code available for payment source

## 2017-11-18 ENCOUNTER — Ambulatory Visit (HOSPITAL_BASED_OUTPATIENT_CLINIC_OR_DEPARTMENT_OTHER): Payer: No Typology Code available for payment source | Admitting: Medical Oncology

## 2017-11-18 ENCOUNTER — Ambulatory Visit: Payer: No Typology Code available for payment source | Attending: Medical Oncology

## 2017-11-18 ENCOUNTER — Other Ambulatory Visit: Payer: Self-pay | Admitting: Nurse Practitioner

## 2017-11-18 DIAGNOSIS — C642 Malignant neoplasm of left kidney, except renal pelvis: Secondary | ICD-10-CM | POA: Insufficient documentation

## 2017-11-18 DIAGNOSIS — C649 Malignant neoplasm of unspecified kidney, except renal pelvis: Secondary | ICD-10-CM | POA: Insufficient documentation

## 2017-11-18 DIAGNOSIS — Z8051 Family history of malignant neoplasm of kidney: Secondary | ICD-10-CM | POA: Insufficient documentation

## 2017-11-18 DIAGNOSIS — C779 Secondary and unspecified malignant neoplasm of lymph node, unspecified: Secondary | ICD-10-CM

## 2017-11-18 LAB — COMP METABOLIC PANEL
ALT (GPT): 13 U/L (ref 7–33)
AST (GOT): 13 U/L (ref 9–38)
Albumin/Globulin Ratio: 1.2 (ref 1.0–2.4)
Albumin: 3.9 g/dL (ref 3.5–5.2)
Alkaline Phosphatase (Total): 68 U/L (ref 25–100)
Anion Gap: 8 (ref 4–12)
Bilirubin (Total): 0.5 mg/dL (ref 0.2–1.3)
Calcium: 9 mg/dL (ref 8.9–10.2)
Carbon Dioxide, Total: 26 meq/L (ref 22–32)
Chloride: 104 meq/L (ref 98–108)
Creatinine: 0.89 mg/dL (ref 0.38–1.02)
GFR, Calc, African American: >60 mL/min/{1.73_m2} (ref >59)
GFR, Calc, European American: >60 mL/min/{1.73_m2} (ref >59)
Globulin: 3.3 g/dL (ref 2.0–3.9)
Glucose: 94 mg/dL (ref 62–125)
Potassium: 4 meq/L (ref 3.6–5.2)
Protein (Total): 7.2 g/dL (ref 6.0–8.2)
Sodium: 138 meq/L (ref 135–145)
Urea Nitrogen: 22 mg/dL — ABNORMAL HIGH (ref 8–21)

## 2017-11-18 LAB — CBC, DIFF
% Basophils: 1 %
% Eosinophils: 1 %
% Immature Granulocytes: 0 %
% Lymphocytes: 23 %
% Monocytes: 6 %
% Neutrophils: 69 %
Absolute Eosinophil Count: 0.08 10*3/uL (ref 0.00–0.50)
Absolute Lymphocyte Count: 1.61 10*3/uL (ref 1.00–4.80)
Basophils: 0.05 10*3/uL (ref 0.00–0.20)
Hematocrit: 40 % (ref 36–45)
Hemoglobin: 13.5 g/dL (ref 11.5–15.5)
Immature Granulocytes: 0.02 10*3/uL (ref 0.00–0.05)
MCH: 30.5 pg (ref 27.3–33.6)
MCHC: 33.6 g/dL (ref 32.2–36.5)
MCV: 91 fL (ref 81–98)
Monocytes: 0.41 10*3/uL (ref 0.00–0.80)
Neutrophils: 4.83 10*3/uL (ref 1.80–7.00)
Platelet Count: 215 10*3/uL (ref 150–400)
RBC: 4.42 10*6/uL (ref 3.80–5.00)
RDW-CV: 12.8 % (ref 11.6–14.4)
WBC: 7 10*3/uL (ref 4.3–10.0)

## 2017-11-18 LAB — LACTATE DEHYDROGENASE: Lactate Dehydrogenase: 146 U/L (ref <210)

## 2018-05-15 ENCOUNTER — Telehealth: Payer: Self-pay | Admitting: Nurse Practitioner

## 2018-05-15 NOTE — Telephone Encounter (Signed)
I called patient to schedule appointment per [facility] for follow up for anxiety. Patient number in not in service.

## 2018-10-05 IMAGING — DX DG KNEE COMPLETE 4+V*R*
4 series · 4 of 4 positions shown · non-contrast
Comparison: No recent prior .

CLINICAL DATA: Fall.

EXAM:
RIGHT KNEE - COMPLETE 4+ VIEW

[knee ap]
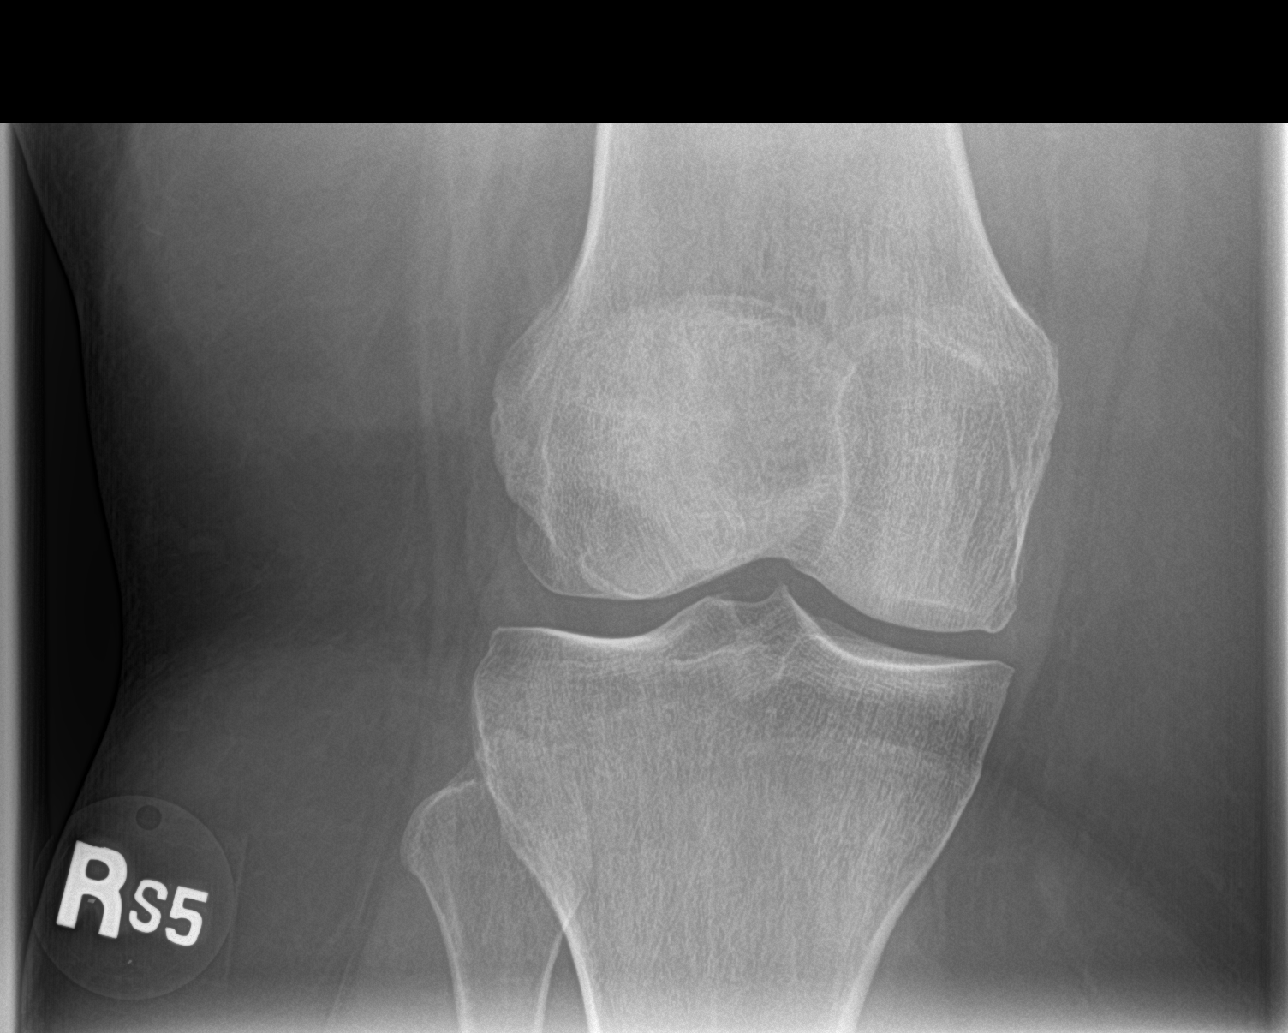

[knee tunnel]
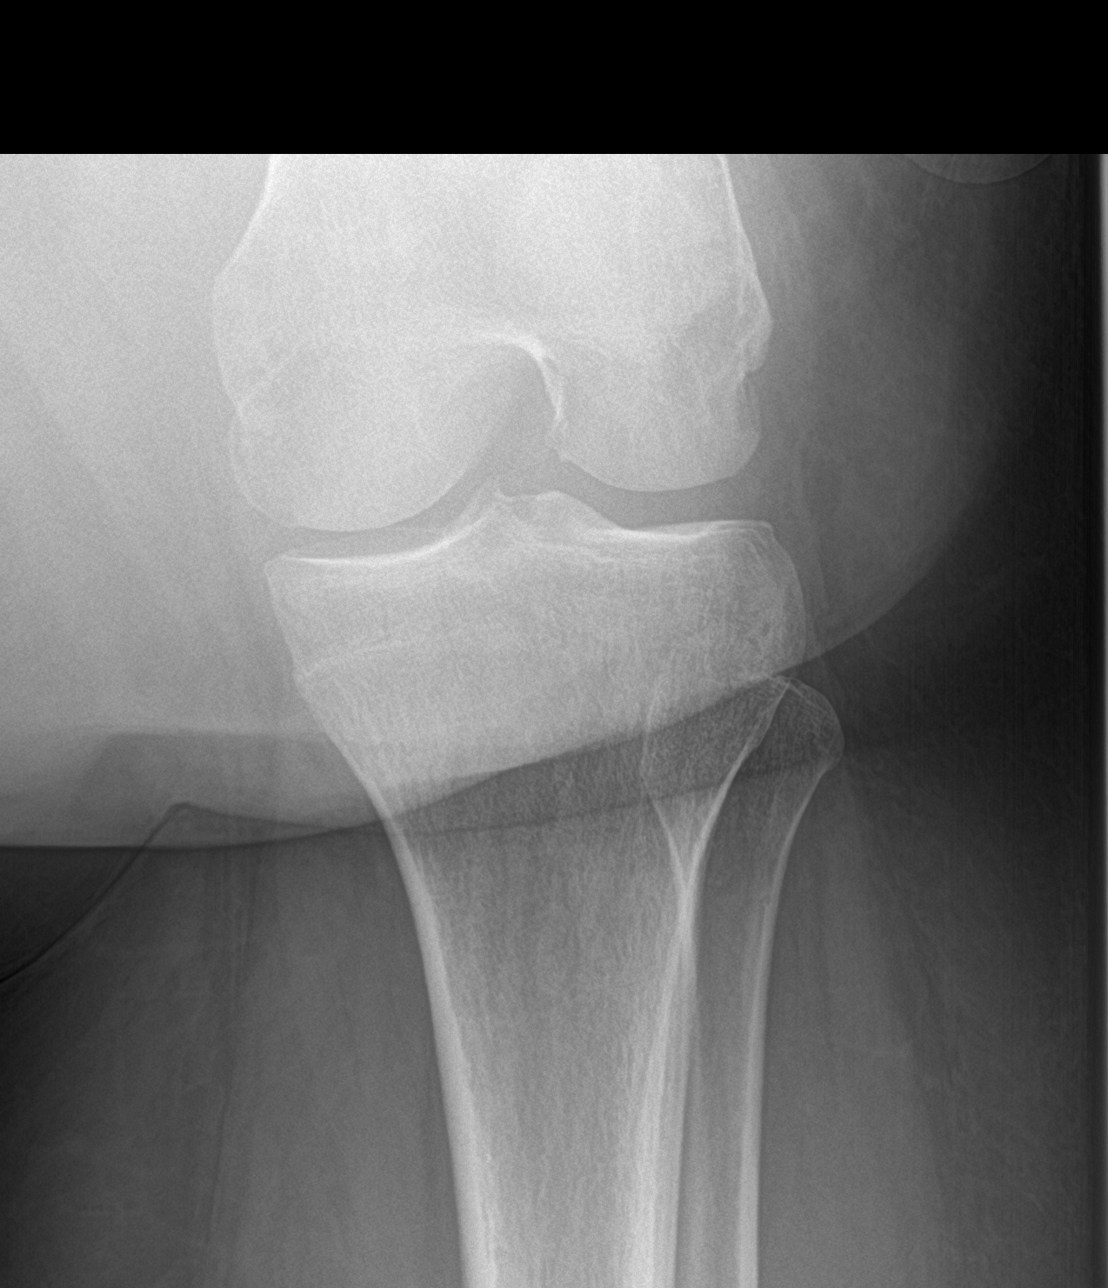

[knee lat]
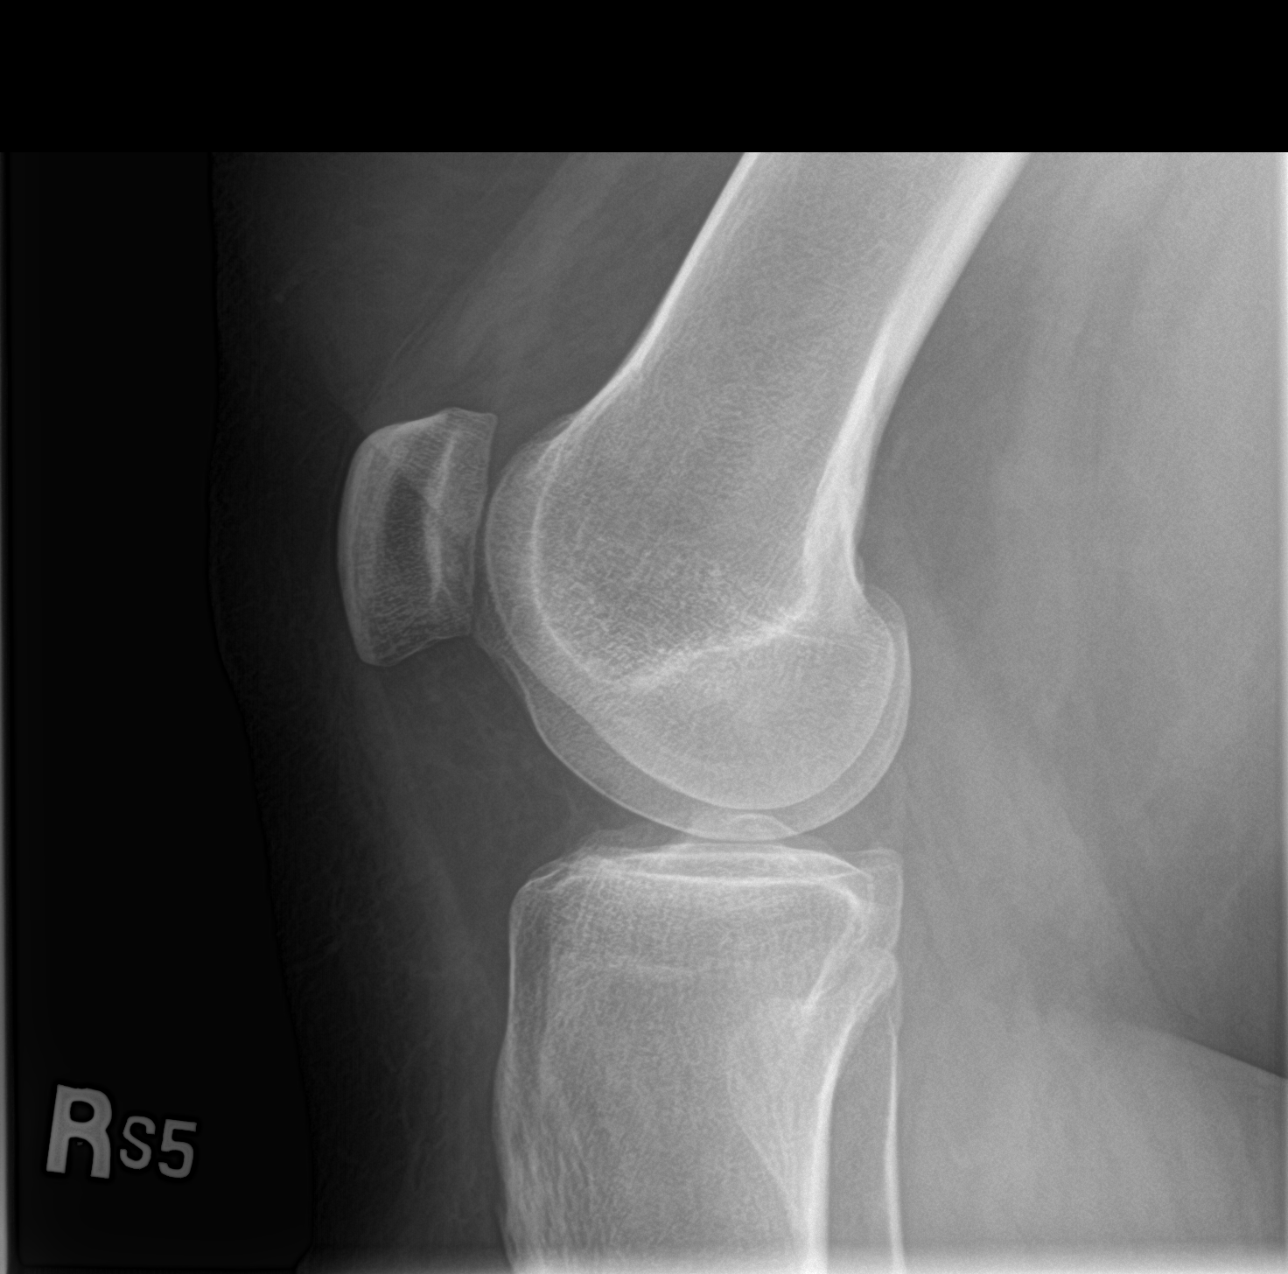

[sunrise]
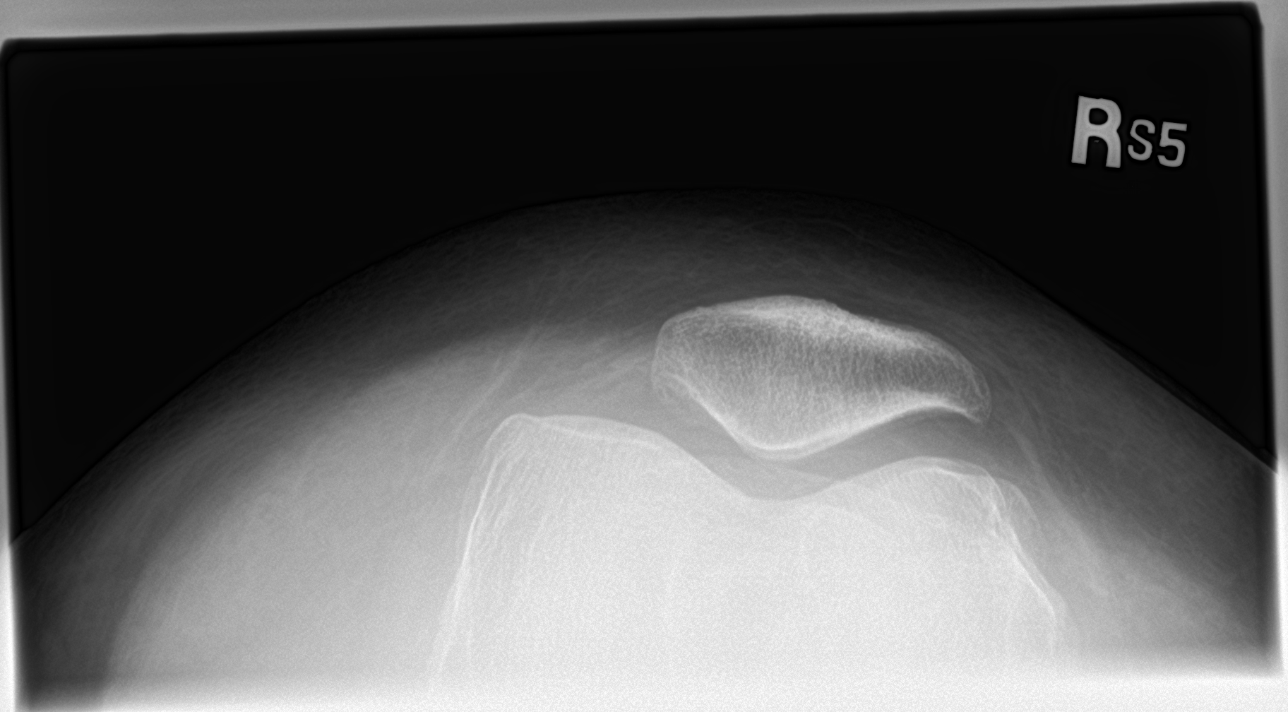

[4 of 4 positions shown; findings below may reference images not displayed]

FINDINGS: No acute bony or joint abnormality identified. No evidence of
fracture or dislocation.
IMPRESSION: No acute abnormality.

## 2018-10-05 IMAGING — DX DG KNEE COMPLETE 4+V*L*
4 series · 4 of 4 positions shown · non-contrast
Comparison: No recent prior .

CLINICAL DATA: Chronic knee pain.

EXAM:
LEFT KNEE - COMPLETE 4+ VIEW

[knee ap]
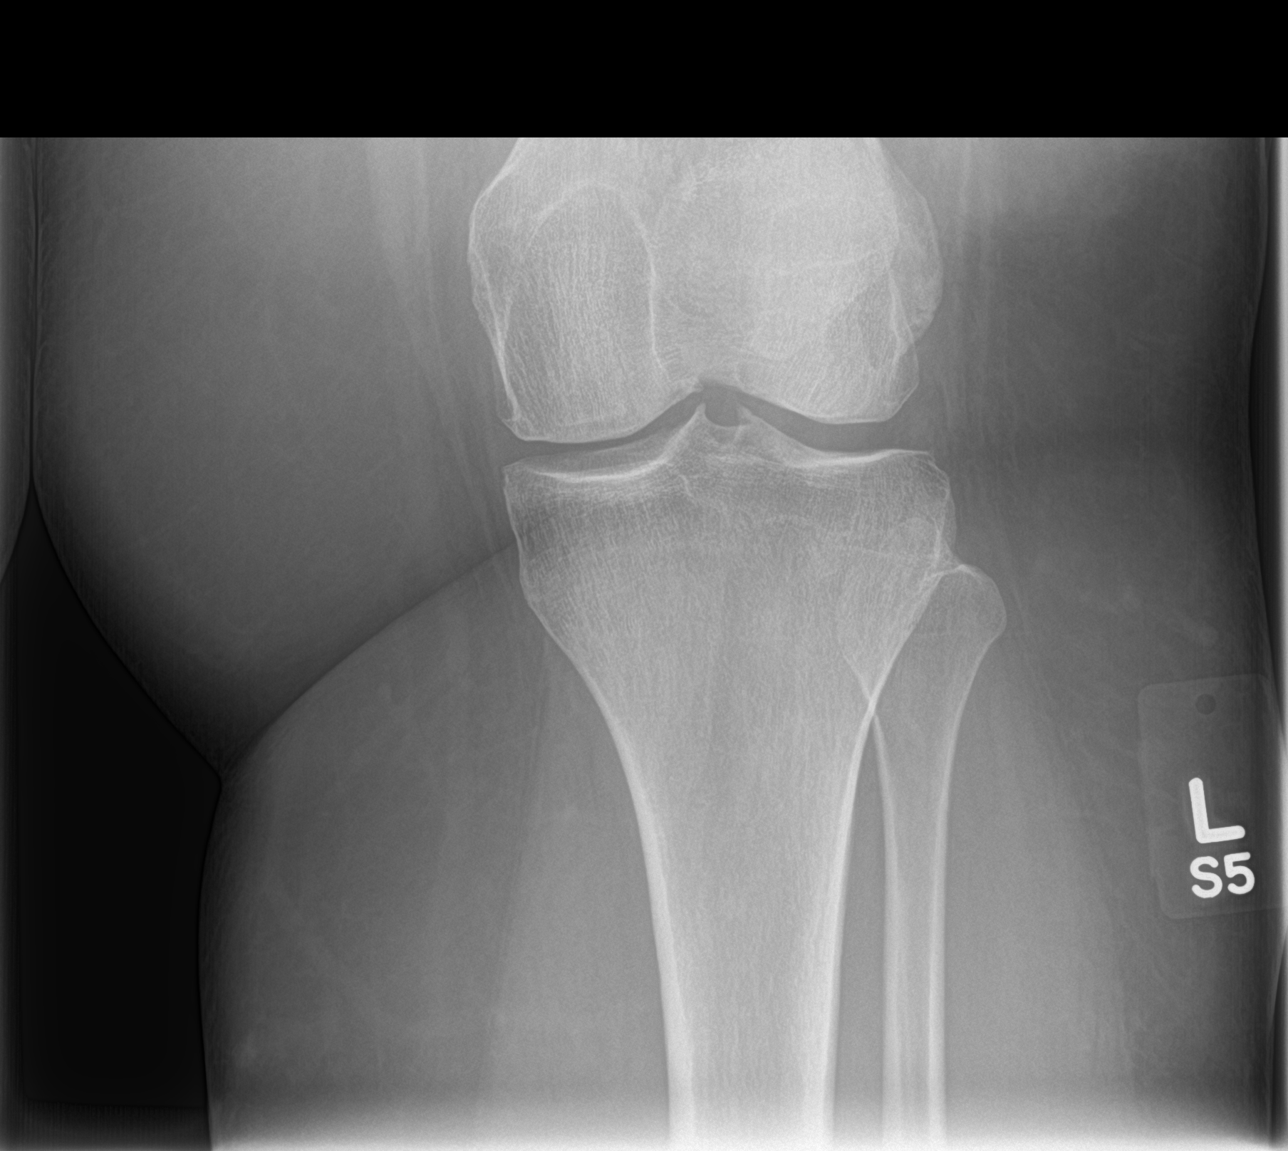

[knee tunnel]
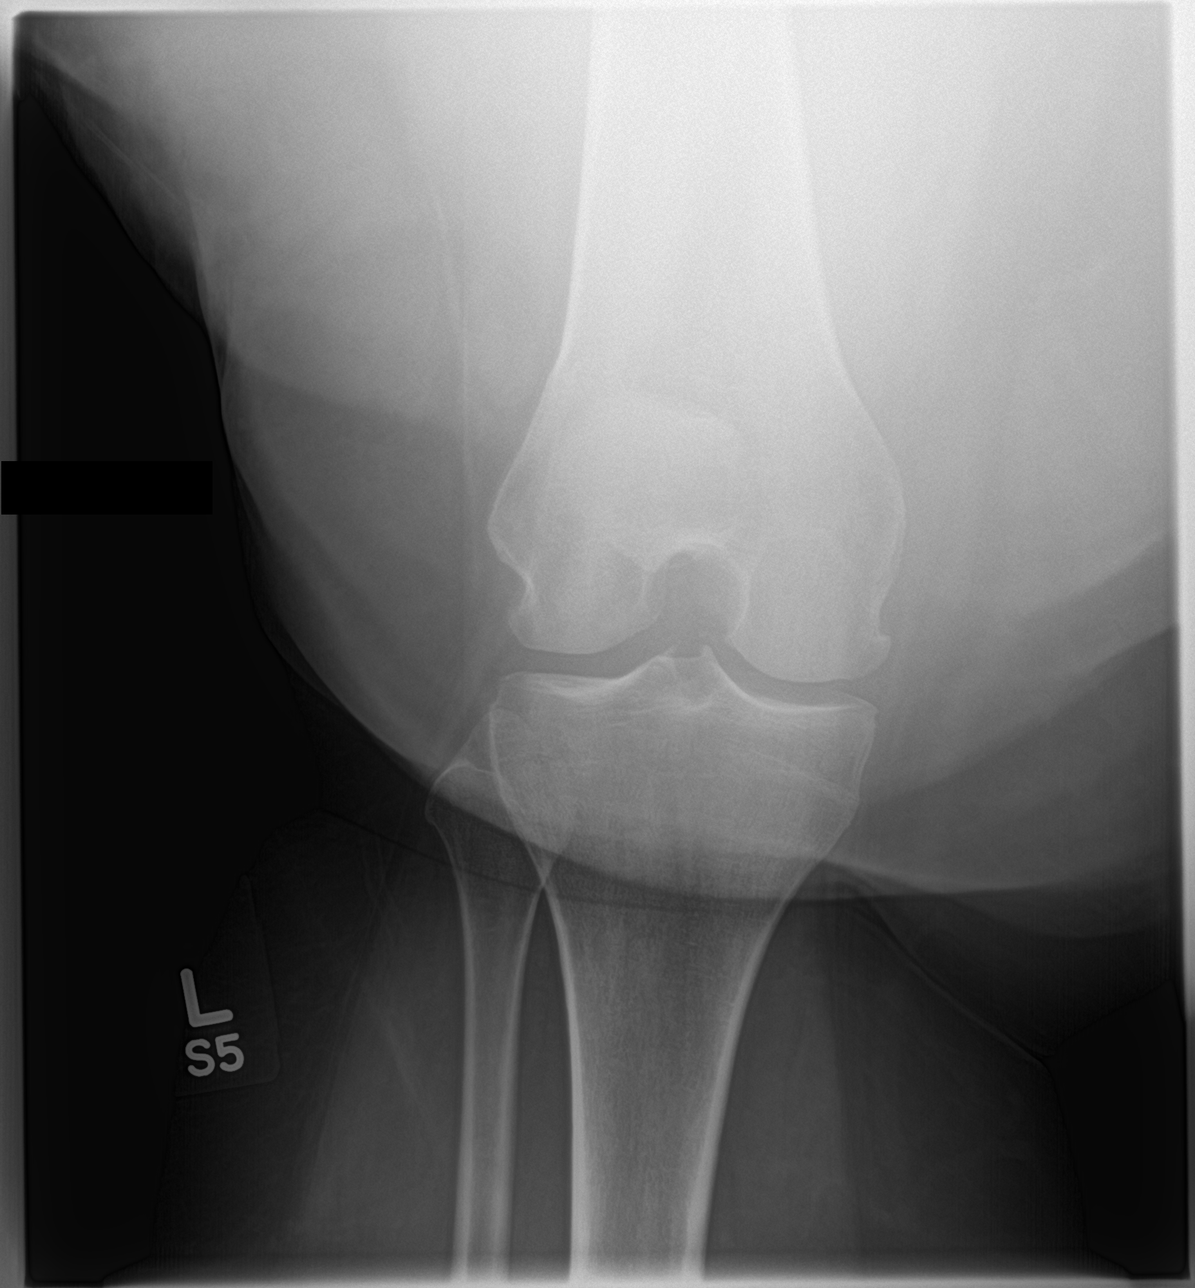

[knee lat]
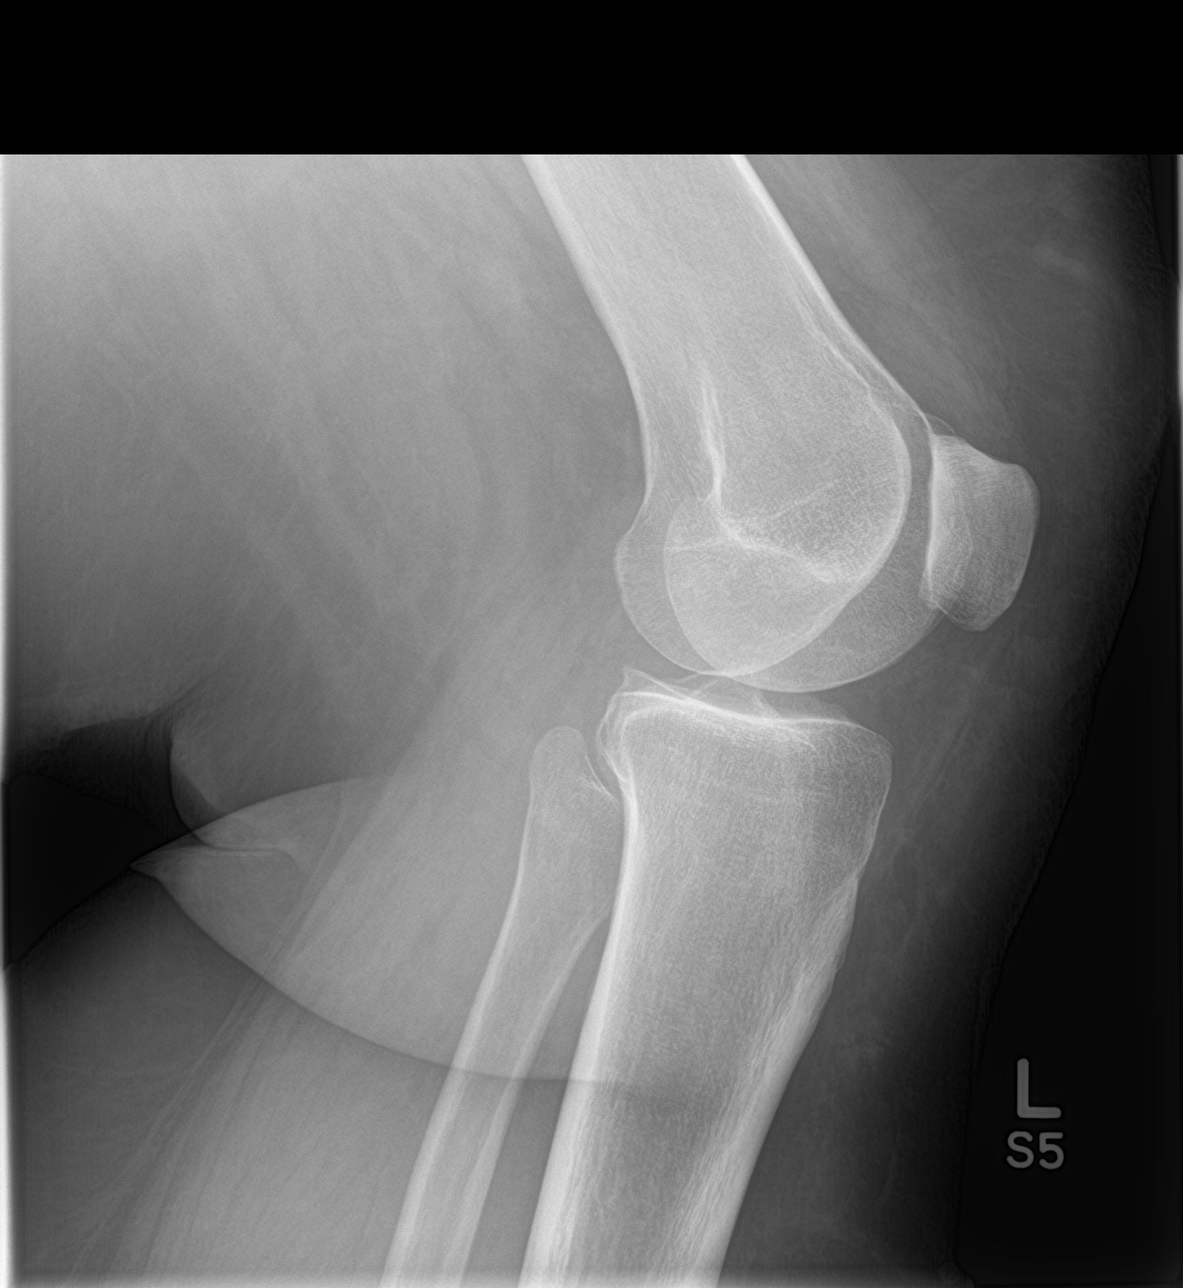

[sunrise]
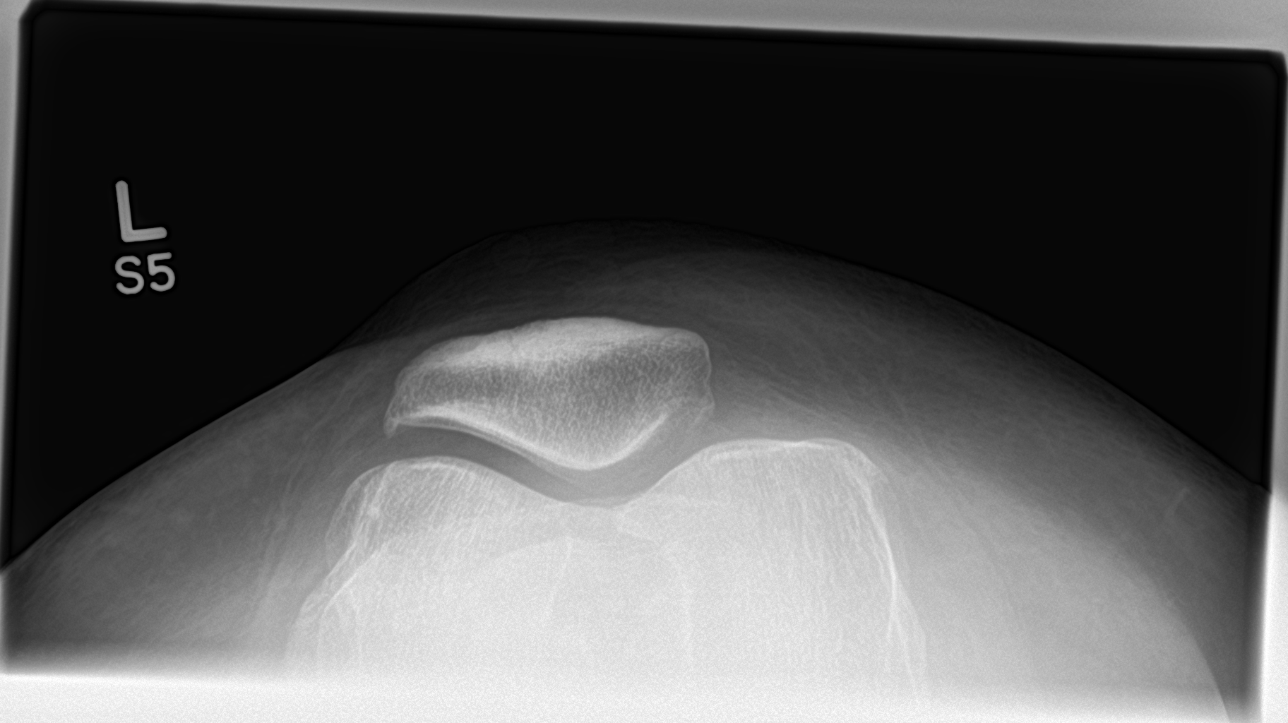

[4 of 4 positions shown; findings below may reference images not displayed]

FINDINGS: No acute bony or joint abnormality identified. No evidence of
fracture or dislocation.
IMPRESSION: No acute abnormality.

## 2018-11-17 ENCOUNTER — Ambulatory Visit (HOSPITAL_BASED_OUTPATIENT_CLINIC_OR_DEPARTMENT_OTHER): Payer: No Typology Code available for payment source

## 2018-11-17 ENCOUNTER — Encounter (HOSPITAL_BASED_OUTPATIENT_CLINIC_OR_DEPARTMENT_OTHER): Payer: Self-pay | Admitting: Medical Oncology

## 2018-11-17 ENCOUNTER — Other Ambulatory Visit: Payer: Self-pay | Admitting: Medical Oncology

## 2018-11-17 ENCOUNTER — Ambulatory Visit: Payer: No Typology Code available for payment source | Attending: Medical Oncology

## 2018-11-17 ENCOUNTER — Ambulatory Visit (HOSPITAL_BASED_OUTPATIENT_CLINIC_OR_DEPARTMENT_OTHER): Payer: No Typology Code available for payment source | Admitting: Medical Oncology

## 2018-11-17 DIAGNOSIS — C779 Secondary and unspecified malignant neoplasm of lymph node, unspecified: Secondary | ICD-10-CM

## 2018-11-17 DIAGNOSIS — Z85528 Personal history of other malignant neoplasm of kidney: Secondary | ICD-10-CM | POA: Insufficient documentation

## 2018-11-17 DIAGNOSIS — C642 Malignant neoplasm of left kidney, except renal pelvis: Secondary | ICD-10-CM

## 2018-11-17 DIAGNOSIS — Z08 Encounter for follow-up examination after completed treatment for malignant neoplasm: Secondary | ICD-10-CM | POA: Insufficient documentation

## 2018-11-17 LAB — CBC, DIFF
% Basophils: 1 %
% Eosinophils: 1 %
% Immature Granulocytes: 0 %
% Lymphocytes: 23 %
% Monocytes: 5 %
% Neutrophils: 70 %
Absolute Eosinophil Count: 0.1 10*3/uL (ref 0.00–0.50)
Absolute Lymphocyte Count: 1.8 10*3/uL (ref 1.00–4.80)
Basophils: 0.06 10*3/uL (ref 0.00–0.20)
Hematocrit: 40 % (ref 36–45)
Hemoglobin: 13.4 g/dL (ref 11.5–15.5)
Immature Granulocytes: 0.03 10*3/uL (ref 0.00–0.05)
MCH: 31.2 pg (ref 27.3–33.6)
MCHC: 33.3 g/dL (ref 32.2–36.5)
MCV: 94 fL (ref 81–98)
Monocytes: 0.41 10*3/uL (ref 0.00–0.80)
Neutrophils: 5.53 10*3/uL (ref 1.80–7.00)
Platelet Count: 234 10*3/uL (ref 150–400)
RBC: 4.3 10*6/uL (ref 3.80–5.00)
RDW-CV: 12.7 % (ref 11.6–14.4)
WBC: 7.93 10*3/uL (ref 4.3–10.0)

## 2018-11-17 LAB — HEPATIC FUNCTION PANEL W/ LD
ALT (GPT): 15 U/L (ref 7–33)
AST (GOT): 12 U/L (ref 9–38)
Albumin: 3.8 g/dL (ref 3.5–5.2)
Alkaline Phosphatase (Total): 75 U/L (ref 25–112)
Bilirubin (Direct): 0.1 mg/dL (ref 0.0–0.3)
Bilirubin (Total): 0.5 mg/dL (ref 0.2–1.3)
Lactate Dehydrogenase: 128 U/L (ref <210)
Protein (Total): 7 g/dL (ref 6.0–8.2)

## 2018-11-17 LAB — BASIC METABOLIC PANEL
Anion Gap: 5 (ref 4–12)
Calcium: 8.6 mg/dL — ABNORMAL LOW (ref 8.9–10.2)
Carbon Dioxide, Total: 26 meq/L (ref 22–32)
Chloride: 109 meq/L — ABNORMAL HIGH (ref 98–108)
Creatinine: 0.83 mg/dL (ref 0.38–1.02)
Glucose: 98 mg/dL (ref 62–125)
Potassium: 3.9 meq/L (ref 3.6–5.2)
Sodium: 140 meq/L (ref 135–145)
Urea Nitrogen: 16 mg/dL (ref 8–21)
eGFR by CKD-EPI: >60 mL/min/{1.73_m2} (ref >59)

## 2019-10-18 ENCOUNTER — Other Ambulatory Visit (HOSPITAL_BASED_OUTPATIENT_CLINIC_OR_DEPARTMENT_OTHER): Payer: Self-pay | Admitting: Medical Oncology

## 2019-10-18 DIAGNOSIS — C642 Malignant neoplasm of left kidney, except renal pelvis: Secondary | ICD-10-CM

## 2019-10-18 DIAGNOSIS — C649 Malignant neoplasm of unspecified kidney, except renal pelvis: Secondary | ICD-10-CM

## 2019-12-01 ENCOUNTER — Ambulatory Visit (HOSPITAL_BASED_OUTPATIENT_CLINIC_OR_DEPARTMENT_OTHER)
Admit: 2019-12-01 | Discharge: 2019-12-01 | Disposition: A | Discharge status: Home / Self Care | Payer: No Typology Code available for payment source | Source: Home / Self Care

## 2019-12-01 ENCOUNTER — Ambulatory Visit
Admit: 2019-12-01 | Discharge: 2019-12-01 | Disposition: A | Discharge status: Home / Self Care | Payer: No Typology Code available for payment source | Attending: Diagnostic Radiology | Admitting: Diagnostic Radiology

## 2019-12-01 ENCOUNTER — Ambulatory Visit (HOSPITAL_BASED_OUTPATIENT_CLINIC_OR_DEPARTMENT_OTHER): Payer: No Typology Code available for payment source | Admitting: Medical Oncology

## 2019-12-01 ENCOUNTER — Ambulatory Visit (HOSPITAL_BASED_OUTPATIENT_CLINIC_OR_DEPARTMENT_OTHER): Payer: No Typology Code available for payment source

## 2019-12-01 DIAGNOSIS — C642 Malignant neoplasm of left kidney, except renal pelvis: Secondary | ICD-10-CM

## 2019-12-01 LAB — BASIC METABOLIC PANEL
Anion Gap: 4 (ref 4–12)
Calcium: 8.3 mg/dL — ABNORMAL LOW (ref 8.9–10.2)
Carbon Dioxide, Total: 28 meq/L (ref 22–32)
Chloride: 106 meq/L (ref 98–108)
Creatinine: 0.88 mg/dL (ref 0.38–1.02)
Glucose: 90 mg/dL (ref 62–125)
Potassium: 4.2 meq/L (ref 3.6–5.2)
Sodium: 138 meq/L (ref 135–145)
Urea Nitrogen: 16 mg/dL (ref 8–21)
eGFR by CKD-EPI: >60 mL/min/{1.73_m2} (ref >59)

## 2019-12-01 LAB — CBC, DIFF
% Basophils: 1 %
% Eosinophils: 1 %
% Immature Granulocytes: 0 %
% Lymphocytes: 30 %
% Monocytes: 6 %
% Neutrophils: 63 %
Absolute Eosinophil Count: 0.1 10*3/uL (ref 0.00–0.50)
Absolute Lymphocyte Count: 2.13 10*3/uL (ref 1.00–4.80)
Basophils: 0.05 10*3/uL (ref 0.00–0.20)
Hematocrit: 38 % (ref 36–45)
Hemoglobin: 12.8 g/dL (ref 11.5–15.5)
Immature Granulocytes: 0.03 10*3/uL (ref 0.00–0.05)
MCH: 32.3 pg (ref 27.3–33.6)
MCHC: 33.7 g/dL (ref 32.2–36.5)
MCV: 96 fL (ref 81–98)
Monocytes: 0.4 10*3/uL (ref 0.00–0.80)
Neutrophils: 4.5 10*3/uL (ref 1.80–7.00)
Platelet Count: 221 10*3/uL (ref 150–400)
RBC: 3.96 10*6/uL (ref 3.80–5.00)
RDW-CV: 12.8 % (ref 11.6–14.4)
WBC: 7.21 10*3/uL (ref 4.3–10.0)

## 2019-12-01 LAB — HEPATIC FUNCTION PANEL
ALT (GPT): 12 U/L (ref 7–33)
AST (GOT): 14 U/L (ref 9–38)
Albumin: 3.7 g/dL (ref 3.5–5.2)
Alkaline Phosphatase (Total): 76 U/L (ref 25–112)
Bilirubin (Direct): 0.1 mg/dL (ref 0.0–0.3)
Bilirubin (Total): 0.7 mg/dL (ref 0.2–1.3)
Protein (Total): 6.7 g/dL (ref 6.0–8.2)

## 2019-12-01 LAB — LACTATE DEHYDROGENASE: Lactate Dehydrogenase: 152 U/L (ref <210)

## 2019-12-02 ENCOUNTER — Encounter (HOSPITAL_BASED_OUTPATIENT_CLINIC_OR_DEPARTMENT_OTHER): Payer: Self-pay | Admitting: Medical Oncology

## 2019-12-02 NOTE — Progress Notes (Signed)
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    ONCOLOGIC HISTORY  1.  Neoadjuvant systemic therapy with bevacizumab plus interferon alpha from 10/2008 through 02/2009.  2.  Status post radical nephrectomy with excision of intra-abdominal tumors and retroperitoneal lymph node dissection completed 03/29/2009.  3.  Comprehensive BROCA gene panel negative for mutations 12/06/2016.    CURRENT THERAPY  None    CHIEF COMPLAINT AND INTERVAL HISTORY  Mallery returns today for scheduled medical oncology evaluation. I last saw her 11/17/2018. Over the past year, she reports no new medical diagnoses or problems. She reports functioning at her normal baseline.    ALLERGIES  Review of patient's allergies indicates:  Allergies   Allergen Reactions   . Oxycodone-Acetaminophen Skin: Itching, Skin: Hives and Other     Other reaction(s): rash/itching       MEDICATIONS  Outpatient Medications Prior to Visit   Medication Sig Dispense Refill   . Cetirizine HCl (ZYRTEC ALLERGY OR) Take by mouth.     . cholecalciferol 25 MCG (1000 UT) tablet Take 1,000 Units by mouth.     . omega-3 (Fish Oil) 1000 MG capsule Take 1,000 mg by mouth daily.       No facility-administered medications prior to visit.      PHYSICAL EXAM  Vitals:  BP 127/84   Pulse 77   Temp 36.6 C (Oral)   Resp 16   Wt (!) 151.2 kg (333 lb 5.4 oz)   SpO2 96%   BMI 48.92 kg/m   Wt Readings from Last 2 Encounters:   12/01/19 (!) 151.2 kg (333 lb 5.4 oz)   11/17/18 (!) 161.3 kg (355 lb 7.9 oz)     Constitutional: No acute distress  The rest of the exam was deferred for COVID-19 precautions.    LABORATORY RESULTS  Lab on 12/01/2019   Component Date Value Ref Range Status   . Sodium 12/01/2019 138  135 - 145 meq/L Final   . Potassium 12/01/2019 4.2  3.6 - 5.2 meq/L Final   . Chloride 12/01/2019 106  98 - 108 meq/L Final   . Carbon  Dioxide, Total 12/01/2019 28  22 - 32 meq/L Final   . Anion Gap 12/01/2019 4  4 - 12 Final   . Glucose 12/01/2019 90  62 - 125 mg/dL Final   . Urea Nitrogen 12/01/2019 16  8 - 21 mg/dL Final   . Creatinine 12/01/2019 0.88  0.38 - 1.02 mg/dL Final   . Calcium 12/01/2019 8.3* 8.9 - 10.2 mg/dL Final   . eGFR, Calculated 12/01/2019 >60  >59 mL/min/[1.73_m2] Final   . GFR, Information 12/01/2019 Calculated GFR by CKD-EPI equation. Inaccurate with changing renal function. See http://depts.YourCloudFront.fr.html.   Final   . WBC 12/01/2019 7.21  4.3 - 10.0 10*3/uL Final   . RBC 12/01/2019 3.96  3.80 - 5.00 10*6/uL Final   . Hemoglobin 12/01/2019 12.8  11.5 - 15.5 g/dL Final   . Hematocrit 12/01/2019 38  36 - 45 % Final   . MCV 12/01/2019 96  81 - 98 fL Final   . MCH 12/01/2019 32.3  27.3 - 33.6 pg Final   . MCHC 12/01/2019 33.7  32.2 - 36.5 g/dL Final   . Platelet Count 12/01/2019 221  150 - 400 10*3/uL Final   . RDW-CV 12/01/2019 12.8  11.6 - 14.4 %  Final   . % Neutrophils 12/01/2019 63  % Final   . % Lymphocytes 12/01/2019 30  % Final   . % Monocytes 12/01/2019 6  % Final   . % Eosinophils 12/01/2019 1  % Final   . % Basophils 12/01/2019 1  % Final   . % Immature Granulocytes 12/01/2019 0  % Final   . Neutrophils 12/01/2019 4.50  1.80 - 7.00 10*3/uL Final   . Absolute Lymphocyte Count 12/01/2019 2.13  1.00 - 4.80 10*3/uL Final   . Monocytes 12/01/2019 0.40  0.00 - 0.80 10*3/uL Final   . Absolute Eosinophil Count 12/01/2019 0.10  0.00 - 0.50 10*3/uL Final   . Basophils 12/01/2019 0.05  0.00 - 0.20 10*3/uL Final   . Immature Granulocytes 12/01/2019 0.03  0.00 - 0.05 10*3/uL Final   . RBC Morphology 12/01/2019 See RBC data   Final   . Platelet Morphology 12/01/2019 See PLT count   Final   . WBC Morphology 12/01/2019 See Diff   Final   . Albumin 12/01/2019 3.7  3.5 - 5.2 g/dL Final   . Protein (Total) 12/01/2019 6.7  6.0 - 8.2 g/dL Final   . Bilirubin (Total) 12/01/2019 0.7  0.2 - 1.3 mg/dL Final   .  Bilirubin (Direct) 12/01/2019 0.1  0.0 - 0.3 mg/dL Final   . Alkaline Phosphatase (Total) 12/01/2019 76  25 - 112 U/L Final   . AST (GOT) 12/01/2019 14  9 - 38 U/L Final   . ALT (GPT) 12/01/2019 12  7 - 33 U/L Final   . Lactate Dehydrogenase 12/01/2019 152  <210 U/L Final       IMAGING  Imaging Results:  XR Chest 2 View 12/01/2019  Narrative: EXAMINATION:  XR CHEST 2 VW    INDICATION:  Kidney cancer     FINDINGS AND  Impression:   Compared to 11/17/2018, lung volume remains slightly low. Lungs are clear.    Heart size is normal and unchanged.    There has been resection of a left lower rib and there is a healed fracture of the rib above it.    US Abdomen Complete 12/01/2019  Narrative: ULTRASOUND: COMPLETE ABDOMEN    CLINICAL INDICATION:  Surveillance for history of stage III RCC s/p left nephrectomy 2011     TECHNIQUE:  Complete transabdominal ultrasound.  Image quality / ultrasound visualization score: No or minimal limitations     COMPARISON:  Abdominal Ultrasound 11/17/2018.    FINDINGS:  LIVER  Normal size, normal echogenicity, normal shape and contour.    MAIN PORTAL VEIN:  Patent with normal flow direction.    GALLBLADDER  Surgically absent.     BILE DUCTS  Non-dilated intra- and extrahepatic bile ducts.  Common bile duct 4.4 mm.     PANCREAS  Normal appearing head and body. The tail is obscured by overlying bowel gas.     RIGHT KIDNEY  Normal size and morphology.   No hydronephrosis.  Length 12.9 cm.     LEFT KIDNEY  Surgically removed. No obvious mass seen in the resection bed.    SPLEEN  Normal size and morphology.    Length 11.2 cm.     BLADDER  Normal.     ABDOMINAL AORTA  Normal.     INFERIOR VENA CAVA  Normal.     PERITONEUM / ASCITES  No evidence of free fluid.  Impression:   No sign of metachronous RCC or metastatic disease.    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    ECOG performance status today was rated 0.    I  reviewed with Euphemia results of her surveillance imaging that included chest x-ray and abdominal ultrasound. There were no abnormal findings and no evidence to suggest a recurrence of her kidney cancer. Her screening laboratory panel was also unremarkable.    I discussed with Yeimi that she is now more than 10 years past the last treatment applied for her kidney cancer which was her nephrectomy surgery with retroperitoneal lymph node dissection in March 2011. I recommended we discontinue further planned surveillance. Future return for oncology care will be on an as-needed basis.    DISPOSITION  Discontinue scheduled surveillance. Return to oncology on an as-needed basis.      This document may have been generated in part using voice recognition software.  Although every effort is made to edit the content, transcription errors may occur.

## 2019-12-14 ENCOUNTER — Other Ambulatory Visit (HOSPITAL_BASED_OUTPATIENT_CLINIC_OR_DEPARTMENT_OTHER): Payer: Self-pay

## 2019-12-14 ENCOUNTER — Encounter (HOSPITAL_BASED_OUTPATIENT_CLINIC_OR_DEPARTMENT_OTHER): Payer: Self-pay | Admitting: Medical Oncology

## 2019-12-14 ENCOUNTER — Other Ambulatory Visit (HOSPITAL_BASED_OUTPATIENT_CLINIC_OR_DEPARTMENT_OTHER): Payer: No Typology Code available for payment source

## 2022-12-18 LAB — PATHOLOGY, SURGICAL
